# Patient Record
Sex: Female | Born: 1985 | Race: Black or African American | Hispanic: No | Marital: Single | Smoking: Former smoker
Health system: Southern US, Community
[De-identification: ages and names within clinical notes are randomized; demographics above are authoritative.]

## PROBLEM LIST (undated history)

## (undated) ENCOUNTER — Inpatient Hospital Stay (HOSPITAL_COMMUNITY): Payer: Self-pay

## (undated) DIAGNOSIS — A6000 Herpesviral infection of urogenital system, unspecified: Secondary | ICD-10-CM

## (undated) DIAGNOSIS — E119 Type 2 diabetes mellitus without complications: Secondary | ICD-10-CM

## (undated) DIAGNOSIS — I10 Essential (primary) hypertension: Secondary | ICD-10-CM

## (undated) DIAGNOSIS — O364XX0 Maternal care for intrauterine death, not applicable or unspecified: Principal | ICD-10-CM

## (undated) DIAGNOSIS — K219 Gastro-esophageal reflux disease without esophagitis: Secondary | ICD-10-CM

## (undated) DIAGNOSIS — J45909 Unspecified asthma, uncomplicated: Secondary | ICD-10-CM

## (undated) DIAGNOSIS — B019 Varicella without complication: Secondary | ICD-10-CM

## (undated) HISTORY — PX: NO PAST SURGERIES: SHX2092

---

## 2014-11-12 ENCOUNTER — Other Ambulatory Visit: Payer: Self-pay | Admitting: Obstetrics and Gynecology

## 2014-11-12 DIAGNOSIS — N6325 Unspecified lump in the left breast, overlapping quadrants: Secondary | ICD-10-CM

## 2014-11-12 DIAGNOSIS — N6320 Unspecified lump in the left breast, unspecified quadrant: Principal | ICD-10-CM

## 2014-11-16 ENCOUNTER — Other Ambulatory Visit: Payer: Self-pay

## 2014-11-19 ENCOUNTER — Other Ambulatory Visit: Payer: Self-pay

## 2016-01-17 NOTE — L&D Delivery Note (Signed)
Patient is 31 y.o. G1P0100 5773w6d admitted for IOL for IUFD and HELLP syndrome. S/p IOL with foley bulb, cytotec, followed by Pitocin.   Delivery Note At 6:39 AM a non-viable female was delivered via Vaginal, Spontaneous Delivery.  APGAR: 0, 0; weight 2 lb 5.7 oz (1069 g).   Placenta status: spontaneous, intact, 50% infarcted, sent to pathology.  Cord:  3 vessel   Anesthesia:  Epidural Episiotomy: None Lacerations: None Suture Repair: N/A Est. Blood Loss (mL): 50  Mom to antepartum.  [facility].    Upon arrival, patient had delivered neonate onto bed without difficulty. Placenta delivered spontaneously with gentle cord traction. Fundus firm with massage and Pitocin. Perineum inspected and found to have no lacerations.  Counts of sharps, instruments, and lap pads were all correct.   Larene BeachMary K Huyen Perazzo, DO PGY-2 09/27/2016, 7:41 AM

## 2016-04-10 LAB — OB RESULTS CONSOLE HIV ANTIBODY (ROUTINE TESTING): HIV: NONREACTIVE

## 2016-04-10 LAB — OB RESULTS CONSOLE RPR: RPR: NONREACTIVE

## 2016-04-10 LAB — OB RESULTS CONSOLE GC/CHLAMYDIA
Chlamydia: NEGATIVE
Gonorrhea: NEGATIVE

## 2016-04-10 LAB — OB RESULTS CONSOLE PLATELET COUNT: Platelets: 279

## 2016-04-10 LAB — OB RESULTS CONSOLE HGB/HCT, BLOOD
HCT: 36
Hemoglobin: 11.9

## 2016-04-10 LAB — OB RESULTS CONSOLE RUBELLA ANTIBODY, IGM: RUBELLA: IMMUNE

## 2016-04-10 LAB — OB RESULTS CONSOLE VARICELLA ZOSTER ANTIBODY, IGG: VARICELLA IGG: IMMUNE

## 2016-04-10 LAB — OB RESULTS CONSOLE ABO/RH: RH Type: POSITIVE

## 2016-04-10 LAB — OB RESULTS CONSOLE HEPATITIS B SURFACE ANTIGEN: HEP B S AG: NEGATIVE

## 2016-09-25 ENCOUNTER — Encounter (HOSPITAL_COMMUNITY): Payer: Self-pay

## 2016-09-25 ENCOUNTER — Emergency Department (HOSPITAL_COMMUNITY): Admission: EM | Admit: 2016-09-25 | Discharge: 2016-09-26 | Discharge status: Against Medical Advice | Payer: Self-pay

## 2016-09-25 ENCOUNTER — Inpatient Hospital Stay (EMERGENCY_DEPARTMENT_HOSPITAL)
Admission: AD | Admit: 2016-09-25 | Discharge: 2016-09-26 | Disposition: A | Discharge status: Home / Self Care | Payer: 59 | Source: Ambulatory Visit | Attending: Obstetrics & Gynecology | Admitting: Obstetrics & Gynecology

## 2016-09-25 DIAGNOSIS — O364XX0 Maternal care for intrauterine death, not applicable or unspecified: Secondary | ICD-10-CM

## 2016-09-25 DIAGNOSIS — O1423 HELLP syndrome (HELLP), third trimester: Secondary | ICD-10-CM

## 2016-09-25 DIAGNOSIS — Z3493 Encounter for supervision of normal pregnancy, unspecified, third trimester: Secondary | ICD-10-CM

## 2016-09-25 HISTORY — DX: Gastro-esophageal reflux disease without esophagitis: K21.9

## 2016-09-25 HISTORY — DX: Unspecified asthma, uncomplicated: J45.909

## 2016-09-25 HISTORY — DX: Maternal care for intrauterine death, not applicable or unspecified: O36.4XX0

## 2016-09-25 NOTE — MAU Note (Signed)
Pt here with report of no heart tones at the office today. Was told to go home and that they would call her tomorrow with a  plan of care. Wanted to come for a second opinion.

## 2016-09-25 NOTE — ED Notes (Signed)
Pt called x 1 no answer

## 2016-09-26 ENCOUNTER — Telehealth: Payer: Self-pay | Admitting: Obstetrics and Gynecology

## 2016-09-26 ENCOUNTER — Encounter (HOSPITAL_COMMUNITY): Payer: Self-pay | Admitting: *Deleted

## 2016-09-26 ENCOUNTER — Emergency Department (HOSPITAL_COMMUNITY): Payer: 59

## 2016-09-26 ENCOUNTER — Inpatient Hospital Stay (HOSPITAL_COMMUNITY)
Admission: AD | Admit: 2016-09-26 | Discharge: 2016-09-30 | DRG: 774 | Disposition: A | Discharge status: Home / Self Care | Payer: 59 | Source: Ambulatory Visit | Attending: Family Medicine | Admitting: Family Medicine

## 2016-09-26 ENCOUNTER — Encounter: Payer: Self-pay | Admitting: Obstetrics & Gynecology

## 2016-09-26 ENCOUNTER — Inpatient Hospital Stay (HOSPITAL_COMMUNITY): Payer: 59 | Admitting: Anesthesiology

## 2016-09-26 DIAGNOSIS — O364XX0 Maternal care for intrauterine death, not applicable or unspecified: Secondary | ICD-10-CM

## 2016-09-26 DIAGNOSIS — O4103X0 Oligohydramnios, third trimester, not applicable or unspecified: Secondary | ICD-10-CM | POA: Diagnosis present

## 2016-09-26 DIAGNOSIS — O1424 HELLP syndrome, complicating childbirth: Secondary | ICD-10-CM | POA: Diagnosis present

## 2016-09-26 DIAGNOSIS — A6000 Herpesviral infection of urogenital system, unspecified: Secondary | ICD-10-CM | POA: Diagnosis present

## 2016-09-26 DIAGNOSIS — E669 Obesity, unspecified: Secondary | ICD-10-CM | POA: Diagnosis present

## 2016-09-26 DIAGNOSIS — O1423 HELLP syndrome (HELLP), third trimester: Secondary | ICD-10-CM | POA: Diagnosis present

## 2016-09-26 DIAGNOSIS — O9832 Other infections with a predominantly sexual mode of transmission complicating childbirth: Secondary | ICD-10-CM | POA: Diagnosis present

## 2016-09-26 DIAGNOSIS — Z3A31 31 weeks gestation of pregnancy: Secondary | ICD-10-CM | POA: Diagnosis not present

## 2016-09-26 DIAGNOSIS — Z87891 Personal history of nicotine dependence: Secondary | ICD-10-CM

## 2016-09-26 DIAGNOSIS — Z6834 Body mass index (BMI) 34.0-34.9, adult: Secondary | ICD-10-CM

## 2016-09-26 DIAGNOSIS — O99214 Obesity complicating childbirth: Secondary | ICD-10-CM | POA: Diagnosis present

## 2016-09-26 HISTORY — DX: Varicella without complication: B01.9

## 2016-09-26 HISTORY — DX: Maternal care for intrauterine death, not applicable or unspecified: O36.4XX0

## 2016-09-26 HISTORY — DX: Herpesviral infection of urogenital system, unspecified: A60.00

## 2016-09-26 LAB — RAPID URINE DRUG SCREEN, HOSP PERFORMED
AMPHETAMINES: NOT DETECTED
BENZODIAZEPINES: NOT DETECTED
Barbiturates: NOT DETECTED
Cocaine: NOT DETECTED
OPIATES: NOT DETECTED
TETRAHYDROCANNABINOL: POSITIVE — AB

## 2016-09-26 LAB — COMPREHENSIVE METABOLIC PANEL
ALBUMIN: 2.7 g/dL — AB (ref 3.5–5.0)
ALBUMIN: 2.6 g/dL — AB (ref 3.5–5.0)
ALBUMIN: 2.7 g/dL — AB (ref 3.5–5.0)
ALK PHOS: 131 U/L — AB (ref 38–126)
ALK PHOS: 125 U/L (ref 38–126)
ALT: 220 U/L — ABNORMAL HIGH (ref 14–54)
ALT: 212 U/L — AB (ref 14–54)
ALT: 213 U/L — AB (ref 14–54)
ALT: 203 U/L — ABNORMAL HIGH (ref 14–54)
ALT: 207 U/L — ABNORMAL HIGH (ref 14–54)
ANION GAP: 10 (ref 5–15)
ANION GAP: 8 (ref 5–15)
ANION GAP: 9 (ref 5–15)
ANION GAP: 8 (ref 5–15)
AST: 174 U/L — AB (ref 15–41)
AST: 175 U/L — AB (ref 15–41)
AST: 167 U/L — ABNORMAL HIGH (ref 15–41)
AST: 165 U/L — ABNORMAL HIGH (ref 15–41)
AST: 141 U/L — ABNORMAL HIGH (ref 15–41)
Albumin: 2.5 g/dL — ABNORMAL LOW (ref 3.5–5.0)
Albumin: 2.7 g/dL — ABNORMAL LOW (ref 3.5–5.0)
Alkaline Phosphatase: 148 U/L — ABNORMAL HIGH (ref 38–126)
Alkaline Phosphatase: 135 U/L — ABNORMAL HIGH (ref 38–126)
Alkaline Phosphatase: 131 U/L — ABNORMAL HIGH (ref 38–126)
Anion gap: 10 (ref 5–15)
BILIRUBIN TOTAL: 1.3 mg/dL — AB (ref 0.3–1.2)
BILIRUBIN TOTAL: 1.4 mg/dL — AB (ref 0.3–1.2)
BILIRUBIN TOTAL: 1.5 mg/dL — AB (ref 0.3–1.2)
BUN: 12 mg/dL (ref 6–20)
BUN: 11 mg/dL (ref 6–20)
BUN: 8 mg/dL (ref 6–20)
BUN: 8 mg/dL (ref 6–20)
BUN: 7 mg/dL (ref 6–20)
CALCIUM: 8.6 mg/dL — AB (ref 8.9–10.3)
CALCIUM: 8.5 mg/dL — AB (ref 8.9–10.3)
CALCIUM: 7.9 mg/dL — AB (ref 8.9–10.3)
CHLORIDE: 102 mmol/L (ref 101–111)
CO2: 20 mmol/L — AB (ref 22–32)
CO2: 20 mmol/L — ABNORMAL LOW (ref 22–32)
CO2: 22 mmol/L (ref 22–32)
CO2: 22 mmol/L (ref 22–32)
CO2: 25 mmol/L (ref 22–32)
CREATININE: 0.67 mg/dL (ref 0.44–1.00)
CREATININE: 0.59 mg/dL (ref 0.44–1.00)
CREATININE: 0.56 mg/dL (ref 0.44–1.00)
Calcium: 8.1 mg/dL — ABNORMAL LOW (ref 8.9–10.3)
Calcium: 7.8 mg/dL — ABNORMAL LOW (ref 8.9–10.3)
Chloride: 106 mmol/L (ref 101–111)
Chloride: 106 mmol/L (ref 101–111)
Chloride: 105 mmol/L (ref 101–111)
Chloride: 105 mmol/L (ref 101–111)
Creatinine, Ser: 0.6 mg/dL (ref 0.44–1.00)
Creatinine, Ser: 0.61 mg/dL (ref 0.44–1.00)
GFR calc Af Amer: >60 mL/min (ref >60)
GFR calc Af Amer: >60 mL/min (ref >60)
GFR calc non Af Amer: >60 mL/min (ref >60)
GFR calc non Af Amer: >60 mL/min (ref >60)
GLUCOSE: 91 mg/dL (ref 65–99)
GLUCOSE: 92 mg/dL (ref 65–99)
GLUCOSE: 88 mg/dL (ref 65–99)
Glucose, Bld: 93 mg/dL (ref 65–99)
Glucose, Bld: 138 mg/dL — ABNORMAL HIGH (ref 65–99)
POTASSIUM: 4.1 mmol/L (ref 3.5–5.1)
POTASSIUM: 4.1 mmol/L (ref 3.5–5.1)
POTASSIUM: 4.7 mmol/L (ref 3.5–5.1)
Potassium: 3.9 mmol/L (ref 3.5–5.1)
Potassium: 4.3 mmol/L (ref 3.5–5.1)
SODIUM: 136 mmol/L (ref 135–145)
SODIUM: 135 mmol/L (ref 135–145)
Sodium: 136 mmol/L (ref 135–145)
Sodium: 136 mmol/L (ref 135–145)
Sodium: 135 mmol/L (ref 135–145)
TOTAL PROTEIN: 6.4 g/dL — AB (ref 6.5–8.1)
TOTAL PROTEIN: 6.7 g/dL (ref 6.5–8.1)
Total Bilirubin: 1.2 mg/dL (ref 0.3–1.2)
Total Bilirubin: 1.1 mg/dL (ref 0.3–1.2)
Total Protein: 6.7 g/dL (ref 6.5–8.1)
Total Protein: 6.1 g/dL — ABNORMAL LOW (ref 6.5–8.1)
Total Protein: 6.9 g/dL (ref 6.5–8.1)

## 2016-09-26 LAB — CBC
HCT: 30.4 % — ABNORMAL LOW (ref 36.0–46.0)
HCT: 29.9 % — ABNORMAL LOW (ref 36.0–46.0)
HCT: 30.2 % — ABNORMAL LOW (ref 36.0–46.0)
HEMATOCRIT: 32.2 % — AB (ref 36.0–46.0)
HEMATOCRIT: 31 % — AB (ref 36.0–46.0)
HEMOGLOBIN: 10.8 g/dL — AB (ref 12.0–15.0)
HEMOGLOBIN: 11.1 g/dL — AB (ref 12.0–15.0)
HEMOGLOBIN: 10.8 g/dL — AB (ref 12.0–15.0)
Hemoglobin: 11.5 g/dL — ABNORMAL LOW (ref 12.0–15.0)
Hemoglobin: 10.7 g/dL — ABNORMAL LOW (ref 12.0–15.0)
MCH: 32.8 pg (ref 26.0–34.0)
MCH: 32.9 pg (ref 26.0–34.0)
MCH: 33 pg (ref 26.0–34.0)
MCH: 33.5 pg (ref 26.0–34.0)
MCH: 33 pg (ref 26.0–34.0)
MCHC: 35.7 g/dL (ref 30.0–36.0)
MCHC: 35.5 g/dL (ref 30.0–36.0)
MCHC: 35.8 g/dL (ref 30.0–36.0)
MCHC: 36.1 g/dL — AB (ref 30.0–36.0)
MCHC: 35.4 g/dL (ref 30.0–36.0)
MCV: 91.7 fL (ref 78.0–100.0)
MCV: 92.7 fL (ref 78.0–100.0)
MCV: 92.3 fL (ref 78.0–100.0)
MCV: 92.9 fL (ref 78.0–100.0)
MCV: 93.2 fL (ref 78.0–100.0)
PLATELETS: 44 10*3/uL — AB (ref 150–400)
PLATELETS: 97 10*3/uL — AB (ref 150–400)
Platelets: 48 10*3/uL — ABNORMAL LOW (ref 150–400)
Platelets: 50 10*3/uL — ABNORMAL LOW (ref 150–400)
Platelets: 116 10*3/uL — ABNORMAL LOW (ref 150–400)
RBC: 3.51 MIL/uL — ABNORMAL LOW (ref 3.87–5.11)
RBC: 3.28 MIL/uL — AB (ref 3.87–5.11)
RBC: 3.36 MIL/uL — ABNORMAL LOW (ref 3.87–5.11)
RBC: 3.22 MIL/uL — ABNORMAL LOW (ref 3.87–5.11)
RBC: 3.24 MIL/uL — ABNORMAL LOW (ref 3.87–5.11)
RDW: 15.3 % (ref 11.5–15.5)
RDW: 15.3 % (ref 11.5–15.5)
RDW: 15.5 % (ref 11.5–15.5)
RDW: 15.4 % (ref 11.5–15.5)
RDW: 15.6 % — AB (ref 11.5–15.5)
WBC: 14.1 10*3/uL — AB (ref 4.0–10.5)
WBC: 15.2 10*3/uL — AB (ref 4.0–10.5)
WBC: 16.4 10*3/uL — AB (ref 4.0–10.5)
WBC: 16.7 10*3/uL — ABNORMAL HIGH (ref 4.0–10.5)
WBC: 14.3 10*3/uL — AB (ref 4.0–10.5)

## 2016-09-26 LAB — URINALYSIS, ROUTINE W REFLEX MICROSCOPIC
Bilirubin Urine: NEGATIVE
Glucose, UA: NEGATIVE mg/dL
Ketones, ur: NEGATIVE mg/dL
Leukocytes, UA: NEGATIVE
NITRITE: NEGATIVE
Protein, ur: 30 mg/dL — AB
SPECIFIC GRAVITY, URINE: 1.016 (ref 1.005–1.030)
pH: 7 (ref 5.0–8.0)

## 2016-09-26 LAB — DIC (DISSEMINATED INTRAVASCULAR COAGULATION)PANEL
D-Dimer, Quant: 19.46 ug/mL-FEU — ABNORMAL HIGH (ref 0.00–0.50)
Platelets: 48 10*3/uL — ABNORMAL LOW (ref 150–400)
Prothrombin Time: 12.4 seconds (ref 11.4–15.2)
Smear Review: NONE SEEN

## 2016-09-26 LAB — DIC (DISSEMINATED INTRAVASCULAR COAGULATION) PANEL
APTT: 33 s (ref 24–36)
FIBRINOGEN: 631 mg/dL — AB (ref 210–475)
INR: 0.93

## 2016-09-26 LAB — PROTEIN / CREATININE RATIO, URINE
Creatinine, Urine: 86 mg/dL
PROTEIN CREATININE RATIO: 0.86 mg/mg{creat} — AB (ref 0.00–0.15)
TOTAL PROTEIN, URINE: 74 mg/dL

## 2016-09-26 LAB — TSH: TSH: 2.492 u[IU]/mL (ref 0.350–4.500)

## 2016-09-26 LAB — ABO/RH: ABO/RH(D): B POS

## 2016-09-26 LAB — HEMOGLOBIN A1C
HEMOGLOBIN A1C: 4.9 % (ref 4.8–5.6)
MEAN PLASMA GLUCOSE: 93.93 mg/dL

## 2016-09-26 LAB — HIV ANTIBODY (ROUTINE TESTING W REFLEX): HIV Screen 4th Generation wRfx: NONREACTIVE

## 2016-09-26 LAB — KLEIHAUER-BETKE STAIN
# VIALS RHIG: 1
Fetal Cells %: 0 %
Quantitation Fetal Hemoglobin: 0 mL

## 2016-09-26 LAB — RPR: RPR Ser Ql: NONREACTIVE

## 2016-09-26 LAB — PREPARE RBC (CROSSMATCH)

## 2016-09-26 LAB — LACTATE DEHYDROGENASE: LDH: 359 U/L — ABNORMAL HIGH (ref 98–192)

## 2016-09-26 MED ORDER — SOD CITRATE-CITRIC ACID 500-334 MG/5ML PO SOLN: 30.0000 mL | ORAL | Status: DC | PRN

## 2016-09-26 MED ORDER — DIPHENHYDRAMINE HCL 50 MG/ML IJ SOLN: 12.5000 mg | INTRAMUSCULAR | Status: DC | PRN

## 2016-09-26 MED ORDER — LABETALOL HCL 5 MG/ML IV SOLN: 20.0000 mg | INTRAVENOUS | Status: DC | PRN

## 2016-09-26 MED ORDER — MAGNESIUM SULFATE BOLUS VIA INFUSION
4.0000 g | Freq: Once | INTRAVENOUS | Status: AC
  Administered 2016-09-26: 4 g via INTRAVENOUS
  Filled 2016-09-26: qty 500

## 2016-09-26 MED ORDER — PHENYLEPHRINE 40 MCG/ML (10ML) SYRINGE FOR IV PUSH (FOR BLOOD PRESSURE SUPPORT)
PREFILLED_SYRINGE | INTRAVENOUS | Status: AC
  Filled 2016-09-26: qty 10

## 2016-09-26 MED ORDER — MISOPROSTOL 200 MCG PO TABS
50.0000 ug | ORAL_TABLET | ORAL | Status: DC
  Administered 2016-09-26: 50 ug via VAGINAL
  Filled 2016-09-26: qty 1

## 2016-09-26 MED ORDER — OXYTOCIN 40 UNITS IN LACTATED RINGERS INFUSION - SIMPLE MED
1.0000 m[IU]/min | INTRAVENOUS | Status: DC
  Administered 2016-09-27: 2 m[IU]/min via INTRAVENOUS
  Filled 2016-09-26: qty 1000

## 2016-09-26 MED ORDER — TRANEXAMIC ACID 1000 MG/10ML IV SOLN
1000.0000 mg | Freq: Once | INTRAVENOUS | Status: DC
  Filled 2016-09-26: qty 10

## 2016-09-26 MED ORDER — LIDOCAINE HCL (PF) 1 % IJ SOLN
INTRAMUSCULAR | Status: DC | PRN
  Administered 2016-09-26 (×2): 5 mL via EPIDURAL

## 2016-09-26 MED ORDER — ACETAMINOPHEN 325 MG PO TABS
650.0000 mg | ORAL_TABLET | ORAL | Status: DC | PRN
  Administered 2016-09-26: 650 mg via ORAL
  Filled 2016-09-26: qty 2

## 2016-09-26 MED ORDER — PHENYLEPHRINE 40 MCG/ML (10ML) SYRINGE FOR IV PUSH (FOR BLOOD PRESSURE SUPPORT)
80.0000 ug | PREFILLED_SYRINGE | INTRAVENOUS | Status: DC | PRN
  Filled 2016-09-26: qty 5

## 2016-09-26 MED ORDER — ONDANSETRON HCL 4 MG/2ML IJ SOLN: 4.0000 mg | Freq: Four times a day (QID) | INTRAMUSCULAR | Status: DC | PRN

## 2016-09-26 MED ORDER — DIPHENHYDRAMINE HCL 25 MG PO CAPS
25.0000 mg | ORAL_CAPSULE | Freq: Once | ORAL | Status: AC
  Administered 2016-09-26: 25 mg via ORAL
  Filled 2016-09-26: qty 1

## 2016-09-26 MED ORDER — FENTANYL CITRATE (PF) 100 MCG/2ML IJ SOLN
50.0000 ug | INTRAMUSCULAR | Status: DC | PRN
  Administered 2016-09-26: 50 ug via INTRAVENOUS
  Filled 2016-09-26: qty 2

## 2016-09-26 MED ORDER — OXYCODONE-ACETAMINOPHEN 5-325 MG PO TABS: 1.0000 | ORAL_TABLET | ORAL | Status: DC | PRN

## 2016-09-26 MED ORDER — MISOPROSTOL 50MCG HALF TABLET
100.0000 ug | ORAL_TABLET | ORAL | Status: AC | PRN
  Administered 2016-09-26 (×3): 100 ug via BUCCAL
  Filled 2016-09-26 (×4): qty 2

## 2016-09-26 MED ORDER — HYDROXYZINE HCL 50 MG PO TABS
50.0000 mg | ORAL_TABLET | Freq: Four times a day (QID) | ORAL | Status: DC | PRN
  Filled 2016-09-26: qty 1

## 2016-09-26 MED ORDER — LACTATED RINGERS IV SOLN: 500.0000 mL | INTRAVENOUS | Status: DC | PRN

## 2016-09-26 MED ORDER — MISOPROSTOL 50MCG HALF TABLET
50.0000 ug | ORAL_TABLET | Freq: Once | ORAL | Status: AC
  Administered 2016-09-26: 50 ug via VAGINAL
  Filled 2016-09-26: qty 1

## 2016-09-26 MED ORDER — LACTATED RINGERS IV SOLN: 500.0000 mL | Freq: Once | INTRAVENOUS | Status: DC

## 2016-09-26 MED ORDER — OXYTOCIN BOLUS FROM INFUSION
500.0000 mL | Freq: Once | INTRAVENOUS | Status: AC
  Administered 2016-09-27: 500 mL via INTRAVENOUS

## 2016-09-26 MED ORDER — HYDRALAZINE HCL 20 MG/ML IJ SOLN: 10.0000 mg | Freq: Once | INTRAMUSCULAR | Status: DC | PRN

## 2016-09-26 MED ORDER — PHENYLEPHRINE 40 MCG/ML (10ML) SYRINGE FOR IV PUSH (FOR BLOOD PRESSURE SUPPORT): 80.0000 ug | PREFILLED_SYRINGE | INTRAVENOUS | Status: DC | PRN

## 2016-09-26 MED ORDER — LACTATED RINGERS IV SOLN
INTRAVENOUS | Status: DC
  Administered 2016-09-26 – 2016-09-27 (×3): via INTRAVENOUS

## 2016-09-26 MED ORDER — EPHEDRINE 5 MG/ML INJ: 10.0000 mg | INTRAVENOUS | Status: DC | PRN

## 2016-09-26 MED ORDER — SODIUM CHLORIDE 0.9 % IV SOLN
Freq: Once | INTRAVENOUS | Status: AC
  Administered 2016-09-26: 16:00:00 via INTRAVENOUS

## 2016-09-26 MED ORDER — ZOLPIDEM TARTRATE 5 MG PO TABS: 5.0000 mg | ORAL_TABLET | Freq: Every evening | ORAL | Status: DC | PRN

## 2016-09-26 MED ORDER — OXYTOCIN 40 UNITS IN LACTATED RINGERS INFUSION - SIMPLE MED
2.5000 [IU]/h | INTRAVENOUS | Status: DC
  Administered 2016-09-27: 2.5 [IU]/h via INTRAVENOUS

## 2016-09-26 MED ORDER — OXYCODONE-ACETAMINOPHEN 5-325 MG PO TABS: 2.0000 | ORAL_TABLET | ORAL | Status: DC | PRN

## 2016-09-26 MED ORDER — FENTANYL 2.5 MCG/ML BUPIVACAINE 1/10 % EPIDURAL INFUSION (WH - ANES)
INTRAMUSCULAR | Status: AC
  Filled 2016-09-26: qty 100

## 2016-09-26 MED ORDER — FENTANYL 2.5 MCG/ML BUPIVACAINE 1/10 % EPIDURAL INFUSION (WH - ANES)
14.0000 mL/h | INTRAMUSCULAR | Status: DC | PRN
  Administered 2016-09-26 – 2016-09-27 (×2): 14 mL/h via EPIDURAL
  Filled 2016-09-26: qty 100

## 2016-09-26 MED ORDER — FLEET ENEMA 7-19 GM/118ML RE ENEM: 1.0000 | ENEMA | Freq: Every day | RECTAL | Status: DC | PRN

## 2016-09-26 MED ORDER — ACETAMINOPHEN 500 MG PO TABS
1000.0000 mg | ORAL_TABLET | Freq: Once | ORAL | Status: AC
  Administered 2016-09-26: 1000 mg via ORAL
  Filled 2016-09-26: qty 2

## 2016-09-26 MED ORDER — SODIUM CHLORIDE 0.9 % IV SOLN
Freq: Once | INTRAVENOUS | Status: AC
  Administered 2016-09-26: 06:00:00 via INTRAVENOUS

## 2016-09-26 MED ORDER — EPHEDRINE 5 MG/ML INJ
10.0000 mg | INTRAVENOUS | Status: DC | PRN
  Filled 2016-09-26: qty 2

## 2016-09-26 MED ORDER — LIDOCAINE HCL (PF) 1 % IJ SOLN
30.0000 mL | INTRAMUSCULAR | Status: DC | PRN
  Filled 2016-09-26: qty 30

## 2016-09-26 MED ORDER — MAGNESIUM SULFATE 40 G IN LACTATED RINGERS - SIMPLE
2.0000 g/h | INTRAVENOUS | Status: DC
  Administered 2016-09-26 – 2016-09-27 (×2): 2 g/h via INTRAVENOUS
  Filled 2016-09-26 (×2): qty 40

## 2016-09-26 NOTE — Progress Notes (Signed)
Introductory visit with [name] and family to introduce spiritual care services upon the news that Deborah James's daughter Deborah James has no heartbeat and there is also concern about pt's health.  Pt is coping well at the moment.  FOB states, he's just trying to be "strong" for everyone and is aware that [name] can provide support for him as well.  Her family [place] from [place] ([place] and [place]). Pt's father is trying to keep everyone cheerful, which [name] appreciates as she's also concerned about her health and worried about her mother's grief.  We discussed the fullness of life and all its feelings and I normalized the need for both laughter and tears.   Family is aware of  resources for support and memory making and have begun discussing what support they would like after Deborah James's birth.  Spiritual Care team will continue to follow.  Please page as further needs arise.  Maryanna ShapeAmanda M. Carley Hammedavee Lomax, M.Div. [facility] Chaplain Pager [phone] Office [phone]     09/26/16 1622  Clinical Encounter Type  Visited With Patient and family together  Visit Type Spiritual support;Social support  Stress Factors  Patient Stress Factors Health changes;Loss  Family Stress Factors Loss;Loss of control

## 2016-09-26 NOTE — Anesthesia Procedure Notes (Signed)
Epidural Patient location during procedure: OB Start time: 09/26/2016 6:15 PM End time: 09/26/2016 6:25 PM  Staffing Anesthesiologist: Jathniel Smeltzer  Preanesthetic Checklist Completed: patient identified, site marked, surgical consent, pre-op evaluation, timeout performed, IV checked, risks and benefits discussed and monitors and equipment checked  Epidural Patient position: sitting Prep: site prepped and draped and DuraPrep Patient monitoring: continuous pulse ox and blood pressure Approach: midline Location: L4-L5 Injection technique: LOR air  Needle:  Needle type: Tuohy  Needle gauge: 17 G Needle length: 9 cm and 9 Needle insertion depth: 7 cm Catheter type: closed end flexible Catheter size: 19 Gauge Catheter at skin depth: 13 cm Test dose: negative  Assessment Events: blood not aspirated, injection not painful, no injection resistance, negative IV test and no paresthesia

## 2016-09-26 NOTE — Progress Notes (Signed)
Delayed entry note - patient seen and examined at 1300. Family supportive at bedside. She is not feeling any contractions or having any bleeding. Asking about expectant plan. Explained that we would add tranexamic acid at delivery to prevent bleeding.  Assessment: IUFD at 31+5 HELLP  Plan: -4u platelets to date (last count 50 despite 2u given over night) -Continue magnesium -Continue miso buccal 100 q4 (has had 2 to date) -Plan for tranexamic acid at delivery  Discussed with Dr. Adrian BlackwaterStinson.  Deborah CashJenna E. Cathryne Mancebo, MD Family Medicine Resident, PGY-3 09/26/2016 3:36 PM

## 2016-09-26 NOTE — H&P (Signed)
Obstetric History and Physical  Caryn Gienger is a 31 y.o. G1P0 with previously diagnosed IUFD at [redacted]w[redacted]d presenting for IOL for recently diagnosed HELLP.  She is a patient of [facility] in [place] and was diagnosed with IUFD yesterday.  She was told she will be called with plan today but she came into MAU earlier this morning for a second opinion.  Ultrasound confirmed diagnosis of IUFD here, and patient was advised to stay for admission and induction.  She reported that she wanted to spend time with her family and wanted to come in for scheduled IOL instead; this was scheduled 09/27/16 at 0730.  IUFD evaluation labs were done and she was discharged and told to return for bleeding or other concerns.    On review of her labs, thrombocytopenia of 48K and elevated LFTS (AST 174/ALT 220) were noted. Add-on LDH was elevated at 359.  Given the diagnosis of HELLP, patient was called and told to come back for urgent management and IOL.  On arrival, she denies any headaches or visual symptoms but endorses pain in her epigastric area and right upper quadrant.  Reviewed her records from [facility] , there was no history of any hypertensive disorders before or during pregnancy.  BP 118-136/74-86.  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  She denies any current contractions, bleeding or leaking of fluid.   Prenatal Course Pregnancy complications or risks: Patient Active Problem List   Diagnosis Date Noted  . HELLP syndrome (HELLP), third trimester 09/26/2016  . Fetal demise, greater than 22 weeks, antepartum 09/26/2016   Prenatal labs and studies: ABO, Rh: --/--/B POS, B POS (09/11 0119) Antibody: NEG (09/11 0119) Rubella: Immune (03/26 0000) RPR: Nonreactive (03/26 0000)  HBsAg: Negative (03/26 0000)  HIV: Non-reactive (03/26 0000)  1 hr Glucola  Normal Pap Negative Genetic screening normal Anatomy US  normal  Past Medical History:  Diagnosis Date  . Asthma   . Chicken pox    As a child  . Fetal demise in singleton pregnancy greater than [redacted] weeks gestation, antepartum 09/26/2016  . Genital HSV   . GERD (gastroesophageal reflux disease)     Past Surgical History:  Procedure Laterality Date  . NO PAST SURGERIES      OB History  Gravida Para Term Preterm AB Living  1            SAB TAB Ectopic Multiple Live Births               # Outcome Date GA Lbr Len/2nd Weight Sex Delivery Anes PTL Lv  1 Current              Social History   Social History  . Marital status: Single    Spouse name: N/A  . Number of children: N/A  . Years of education: N/A   Social History Main Topics  . Smoking status: Former Smoker    Quit date: 02/27/2016  . Smokeless tobacco: Never Used  . Alcohol use No  . Drug use: No  . Sexual activity: Yes   Other Topics Concern  . None   Social History Narrative  . None    History reviewed. No pertinent family history.  Prescriptions Prior to Admission  Medication Sig Dispense Refill Last Dose  . prenatal vitamin w/FE, FA (PRENATAL 1 + 1) 27-1 MG TABS tablet Take 1 tablet by mouth daily at 12 noon.  09/26/2016 at Unknown time    No Known Allergies  Review of Systems: Negative except for what is mentioned in HPI.  Physical Exam: BP (!) 149/95   Pulse 73   Temp 98.9 F (37.2 C)   Resp 18   Ht  (1.854 m)   Wt 262 lb (118.8 kg)   BMI 34.57 kg/m  CONSTITUTIONAL: Well-developed, well-nourished, obese,  female in no acute distress.  HENT:  Normocephalic, atraumatic, External right and left ear normal. Oropharynx is clear and moist EYES: Conjunctivae and EOM are normal. Pupils are equal, round, and reactive to light. No scleral icterus.  NECK: Normal range of motion, supple, no masses SKIN: Skin is warm and dry. No rash noted. Not diaphoretic. No erythema. No pallor. NEUROLOGIC: Alert and oriented to person, place, and time. 2+  reflexes, muscle tone coordination. No cranial nerve deficit noted. PSYCHIATRIC: Normal mood and affect. Normal behavior. Normal judgment and thought content. CARDIOVASCULAR: Normal heart rate noted, regular rhythm RESPIRATORY: Effort and breath sounds normal, no problems with respiration noted ABDOMEN: Soft, +moderate RUQ/epigastric tenderness, no rebound,, no guarding. nondistended, gravid. CERVIX: Closed/long/thick/posterior MUSCULOSKELETAL: Normal range of motion. No edema and no tenderness. 2+ distal pulses.   Pertinent Labs/Studies: 09/26/2016 Ultrasound  IUFD at [redacted]w[redacted]d. Oligohydramnios.  Cephalic presentation. Biometry for [facility] and FL <3%  Results for orders placed or performed during the hospital encounter of 09/26/16 (from the past 168 hour(s))  Lactate dehydrogenase   Collection Time: 09/26/16  1:19 AM  Result Value Ref Range   LDH 359 (H) 98 - 192 U/L  Prepare Pheresed Platelets   Collection Time: 09/26/16  3:30 AM  Result Value Ref Range   Unit Number [id number]    Blood Component Type PLTP LR2 PAS    Unit division 00    Status of Unit ALLOCATED    Transfusion Status OK TO TRANSFUSE    Unit Number [id number]    Blood Component Type PLTP LR3 PAS    Unit division 00    Status of Unit ALLOCATED    Transfusion Status OK TO TRANSFUSE   BPAM Platelet Pheresis   Collection Time: 09/26/16  3:30 AM  Result Value Ref Range   Blood Product Unit Number [id number]    Unit Type and Rh 6200    Blood Product Expiration Date [id number]    Blood Product Unit Number [id number]    Unit Type and Rh 7300    Blood Product Expiration Date [id number]   CBC   Collection Time: 09/26/16  4:13 AM  Result Value Ref Range   WBC 15.2 (H) 4.0 - 10.5 K/uL   RBC 3.28 (L) 3.87 - 5.11 MIL/uL   Hemoglobin 10.8 (L) 12.0 - 15.0 g/dL   HCT 33.2 (L) 95.1 - 88.4 %   MCV 92.7 78.0 - 100.0 fL   MCH 32.9 26.0 - 34.0 pg   MCHC 35.5 30.0 - 36.0 g/dL   RDW 16.6 06.3 - 01.6 %   Platelets 44  (L) 150 - 400 K/uL  Results for orders placed or performed during the hospital encounter of 09/25/16 (from the past 168 hour(s))  CBC   Collection Time: 09/26/16  1:17 AM  Result Value Ref Range   WBC 14.1 (H) 4.0 - 10.5 K/uL   RBC 3.51 (L) 3.87 - 5.11 MIL/uL   Hemoglobin 11.5 (L) 12.0 - 15.0 g/dL   HCT 01.0 (L) 93.2 - 35.5 %   MCV 91.7 78.0 - 100.0 fL   MCH 32.8 26.0 - 34.0  pg   MCHC 35.7 30.0 - 36.0 g/dL   RDW 72.515.3 36.611.5 - 44.015.5 %   Platelets 48 (L) 150 - 400 K/uL  Kleihauer-Betke stain   Collection Time: 09/26/16  1:17 AM  Result Value Ref Range   Fetal Cells % 0 %   Quantitation Fetal Hemoglobin 0 mL   # Vials RhIg 1   TSH   Collection Time: 09/26/16  1:17 AM  Result Value Ref Range   TSH 2.492 0.350 - 4.500 uIU/mL  Hemoglobin A1c   Collection Time: 09/26/16  1:17 AM  Result Value Ref Range   Hgb A1c MFr Bld 4.9 4.8 - 5.6 %   Mean Plasma Glucose 93.93 mg/dL  Comprehensive metabolic panel   Collection Time: 09/26/16  1:17 AM  Result Value Ref Range   Sodium 136 135 - 145 mmol/L   Potassium 4.1 3.5 - 5.1 mmol/L   Chloride 106 101 - 111 mmol/L   CO2 20 (L) 22 - 32 mmol/L   Glucose, Bld 91 65 - 99 mg/dL   BUN 12 6 - 20 mg/dL   Creatinine, Ser 3.470.60 0.44 - 1.00 mg/dL   Calcium 8.6 (L) 8.9 - 10.3 mg/dL   Total Protein 6.4 (L) 6.5 - 8.1 g/dL   Albumin 2.7 (L) 3.5 - 5.0 g/dL   AST 425174 (H) 15 - 41 U/L   ALT 220 (H) 14 - 54 U/L   Alkaline Phosphatase 148 (H) 38 - 126 U/L   Total Bilirubin 1.3 (H) 0.3 - 1.2 mg/dL   GFR calc non Af Amer >60 >60 mL/min   GFR calc Af Amer >60 >60 mL/min   Anion gap 10 5 - 15  DIC (disseminated intravasc coag) panel   Collection Time: 09/26/16  1:17 AM  Result Value Ref Range   Prothrombin Time 12.4 11.4 - 15.2 seconds   INR 0.93    aPTT 33 24 - 36 seconds   Fibrinogen 631 (H) 210 - 475 mg/dL   D-Dimer, Quant 95.6319.46 (H) 0.00 - 0.50 ug/mL-FEU   Platelets 48 (L) 150 - 400 K/uL   Smear Review NO SCHISTOCYTES SEEN   Type and screen    Collection Time: 09/26/16  1:19 AM  Result Value Ref Range   ABO/RH(D) B POS    Antibody Screen NEG    Sample Expiration 09/29/2016   ABO/Rh   Collection Time: 09/26/16  1:19 AM  Result Value Ref Range   ABO/RH(D) B POS    Assessment : Walker ShadowShanee Hovanec is a 31 y.o. G1P0 at 5557w5d being admitted for induction of labor due to intrauterine fetal demise in the setting of HELLP.  Plan: Labor: Will start induction with misoprostol 100 mcg every 6 hours.  Analgesia as needed. HELLP: No BP medication needed for now, Labetalol protocol ordered prn.  Magnesium sulfate ordered for eclampsia prophylaxis. Will transfuse with one unit of platelets now, Blood Bank notified to get more units ready for this patient. Will check q 6 hour CBC and CMET. Delivery plan: Hopeful for vaginal delivery.    Jaynie CollinsUGONNA  Aaiden Depoy, MD, FACOG Attending Obstetrician & Gynecologist Faculty Practice,  [facility]

## 2016-09-26 NOTE — Progress Notes (Signed)
Cervical exam unchanged despite Cytotec x3. Attempted foley bulb without success given FT os, long, high. Will proceed with 2 more doses of Cytotec then reattempt FB. Plt's to be rechecked after 2nd set of 2u plts given this afternoon.  Burna CashJenna E. Kenshin Splawn, MD Family Medicine Resident, PGY-3 09/26/2016 5:32 PM

## 2016-09-26 NOTE — MAU Provider Note (Signed)
History     CSN: [id number]  Arrival date and time: 09/25/16 2306   First Provider Initiated Contact with Patient 09/26/16 0003      Chief Complaint  Patient presents with  . Decreased Fetal Movement   HPI  Deborah James is a 31 y.o. G1P0 at 9424w5d gestation presenting to MAU for a "second opinion".  She was seen at her regular OB appt today and was told her "baby had no heartbeat".  She was sent home and told someone would call "to check up on her and let her know the plan of care".  She states she is nervous and not sure what to do.  She denies pain, VB or LOF.  Past Medical History:  Diagnosis Date  . Asthma   . Fetal demise in singleton pregnancy greater than [redacted] weeks gestation, antepartum 09/26/2016  . GERD (gastroesophageal reflux disease)     Past Surgical History:  Procedure Laterality Date  . NO PAST SURGERIES      History reviewed. No pertinent family history.  Social History  Substance Use Topics  . Smoking status: Former Smoker    Quit date: 02/27/2016  . Smokeless tobacco: Never Used  . Alcohol use No    Allergies: No Known Allergies  Prescriptions Prior to Admission  Medication Sig Dispense Refill Last Dose  . prenatal vitamin w/FE, FA (PRENATAL 1 + 1) 27-1 MG TABS tablet Take 1 tablet by mouth daily at 12 noon.   09/26/2016 at Unknown time    Review of Systems  Constitutional: Negative.   HENT: Negative.   Eyes: Negative.   Respiratory: Negative.   Cardiovascular: Negative.   Gastrointestinal: Negative.   Endocrine: Negative.   Genitourinary: Negative.        No fetal movement; "told by doctor baby has no heartbeat"  Musculoskeletal: Negative.   Skin: Negative.   Allergic/Immunologic: Negative.   Neurological: Negative.   Hematological: Negative.   Psychiatric/Behavioral: Negative.    Physical Exam   Blood pressure 138/82, pulse 75, temperature 98.7 F (37.1 C), temperature source Oral, resp. rate 16, height 6\' 1"  (1.854 m), weight  118.8 kg (262 lb), SpO2 100 %.  Physical Exam  Constitutional: She is oriented to person, place, and time. She appears well-developed and well-nourished.  HENT:  Head: Normocephalic.  Eyes: Pupils are equal, round, and reactive to light.  Neck: Normal range of motion.  Cardiovascular: Normal rate, regular rhythm and normal heart sounds.   Respiratory: Effort normal and breath sounds normal.  GI: Soft. Bowel sounds are normal.  Musculoskeletal: Normal range of motion.  Neurological: She is alert and oriented to person, place, and time.  Skin: Skin is warm and dry.  Psychiatric: She has a normal mood and affect. Her behavior is normal. Judgment and thought content normal.    MAU Course  Procedures  MDM CCUA CBC UDS KLB TSH HIV HSV 1 & 2 IgG DIC HgB A1C RPR  Rubella Toxoplasma CMV IgG OB Limited U/S  *Consult with Dr. Macon LargeAnyanwu - notified of patient's complaints, assessments, lab & U/S results, recommend tx plan offer pt IOL d/t where she lives and convenience for her and family at this time, discuss with pt non-emergent IOL can be scheduled as well or she can [facility], get ROI for records incase she decides to deliver here - ok to d/c home, pt notified of IOL scheduled for 09/27/2016 @ 0730.  Assessment and Plan  Fetal demise  in singleton pregnancy greater than [redacted] weeks gestation, antepartum - Labs pending - Return for IOL on 09/27/2016 at 0715 for registration and appt time at 0730 - Advised to return to MAU for any pain, VB or LOF  Discharge home Patient verbalized an understanding of the plan of care and agrees.   Raelyn Mora, MSN, CNM 09/26/2016, 12:04 AM

## 2016-09-26 NOTE — Progress Notes (Signed)
Deborah James is a 31 y.o. G1P0 at 4638w5d by admitted for IOL for IUFD.  Subjective: Doing well.  Some nausea and vomiting that has resolved.  No headache, vision changes, RUQ or epigastric pain.  In good spirits with family at bedside.  Objective: BP (!) 142/94   Pulse 72   Temp 98.8 F (37.1 C) (Oral)   Resp 18   Ht 6\' 1"  (1.854 m)   Wt 118.8 kg (262 lb)   SpO2 100%   BMI 34.57 kg/m  I/O last 3 completed shifts: In: 3979.9 [P.O.:920; I.V.:1947.9; Blood:1112] Out: 3050 [Urine:3050] Total I/O In: 260.1 [P.O.:112; I.V.:125; Other:23.1] Out: 150 [Urine:150]   UC:  q6 min SVE:   Dilation: 1 Effacement (%): 50 Station: -3 Exam by:: dr Deborah James  Labs: Lab Results  Component Value Date   WBC 16.7 (H) 09/26/2016   HGB 10.8 (L) 09/26/2016   HCT 29.9 (L) 09/26/2016   MCV 92.9 09/26/2016   PLT 116 (L) 09/26/2016    Assessment / Plan: IOL for IUFD  Labor: progressing s/p cytotec, FB placed successfully, continue to monitor Preeclampsia:  on magnesium sulfate and no signs or symptoms of toxicity Fetal Wellbeing:  N/A Pain Control:  Epidural I/D:  n/a Anticipated MOD:  NSVD  Deborah LeschMary K Ona Rathert DO PGY-2 09/26/2016, 9:05 PM

## 2016-09-26 NOTE — Progress Notes (Signed)
Faculty Practice OB/GYN Attending Note   Labs evaluated and concerning for HELLP.  Records reviewed from [place], no history of any hypertensive disorder or other significant problems during this pregnancy.  When she arrived to MAU her BP was 142/87 then decreased to 138/82, and she denied any BP problems at that time.  She left prior to the labs being completed as she wanted to be with family during this rough time.  Results for orders placed or performed during the hospital encounter of 09/25/16 (from the past 24 hour(s))  CBC     Status: Abnormal   Collection Time: 09/26/16  1:17 AM  Result Value Ref Range   WBC 14.1 (H) 4.0 - 10.5 K/uL   RBC 3.51 (L) 3.87 - 5.11 MIL/uL   Hemoglobin 11.5 (L) 12.0 - 15.0 g/dL   HCT 16.1 (L) 09.6 - 04.5 %   MCV 91.7 78.0 - 100.0 fL   MCH 32.8 26.0 - 34.0 pg   MCHC 35.7 30.0 - 36.0 g/dL   RDW 40.9 81.1 - 91.4 %   Platelets 48 (L) 150 - 400 K/uL  TSH     Status: None   Collection Time: 09/26/16  1:17 AM  Result Value Ref Range   TSH 2.492 0.350 - 4.500 uIU/mL  Hemoglobin A1c     Status: None   Collection Time: 09/26/16  1:17 AM  Result Value Ref Range   Hgb A1c MFr Bld 4.9 4.8 - 5.6 %   Mean Plasma Glucose 93.93 mg/dL  Comprehensive metabolic panel     Status: Abnormal   Collection Time: 09/26/16  1:17 AM  Result Value Ref Range   Sodium 136 135 - 145 mmol/L   Potassium 4.1 3.5 - 5.1 mmol/L   Chloride 106 101 - 111 mmol/L   CO2 20 (L) 22 - 32 mmol/L   Glucose, Bld 91 65 - 99 mg/dL   BUN 12 6 - 20 mg/dL   Creatinine, Ser 7.82 0.44 - 1.00 mg/dL   Calcium 8.6 (L) 8.9 - 10.3 mg/dL   Total Protein 6.4 (L) 6.5 - 8.1 g/dL   Albumin 2.7 (L) 3.5 - 5.0 g/dL   AST 956 (H) 15 - 41 U/L   ALT 220 (H) 14 - 54 U/L   Alkaline Phosphatase 148 (H) 38 - 126 U/L   Total Bilirubin 1.3 (H) 0.3 - 1.2 mg/dL   GFR calc non Af Amer >60 >60 mL/min   GFR calc Af Amer >60 >60 mL/min   Anion gap 10 5 - 15  DIC (disseminated intravasc coag) panel     Status: Abnormal    Collection Time: 09/26/16  1:17 AM  Result Value Ref Range   Prothrombin Time 12.4 11.4 - 15.2 seconds   INR 0.93    aPTT 33 24 - 36 seconds   Fibrinogen 631 (H) 210 - 475 mg/dL   D-Dimer, Quant 21.30 (H) 0.00 - 0.50 ug/mL-FEU   Platelets 48 (L) 150 - 400 K/uL   Smear Review NO SCHISTOCYTES SEEN   Type and screen     Status: None   Collection Time: 09/26/16  1:19 AM  Result Value Ref Range   ABO/RH(D) B POS    Antibody Screen NEG    Sample Expiration 09/29/2016    Patient was called at home and told to return to hospital immediately for IOL now due to HELLP. L&D RN in charge notified.  Blood bank called and notified of need for platelet transfusion. Will check labs q6h. Hopeful  for induction of labor leading to vaginal delivery; operative delivery will only be considered for rapidly deterioration of patient status.       Jaynie CollinsUGONNA  ANYANWU, MD, FACOG Attending Obstetrician & Gynecologist Faculty Practice, [facility]

## 2016-09-26 NOTE — Anesthesia Preprocedure Evaluation (Addendum)
Anesthesia Evaluation  Patient identified by MRN, date of birth, ID band Patient awake    Reviewed: Allergy & Precautions, H&P , NPO status , Patient's Chart, lab work & pertinent test results, reviewed documented beta blocker date and time   Airway Mallampati: I  TM Distance: >3 FB Neck ROM: full    Dental no notable dental hx.    Pulmonary neg pulmonary ROS, former smoker,    Pulmonary exam normal breath sounds clear to auscultation       Cardiovascular hypertension, Pt. on medications negative cardio ROS Normal cardiovascular exam Rhythm:regular Rate:Normal     Neuro/Psych negative neurological ROS  negative psych ROS   GI/Hepatic negative GI ROS, Neg liver ROS,   Endo/Other  negative endocrine ROS  Renal/GU negative Renal ROS  negative genitourinary   Musculoskeletal   Abdominal   Peds  Hematology negative hematology ROS (+)   Anesthesia Other Findings HELLP syndrome , third trimester 09/26/2016 . Fetal demise, greater than 22 weeks, antepartum    Reproductive/Obstetrics (+) Pregnancy                             Anesthesia Physical Anesthesia Plan  ASA: III  Anesthesia Plan: Epidural   Post-op Pain Management:    Induction:   PONV Risk Score and Plan:   Airway Management Planned:   Additional Equipment:   Intra-op Plan:   Post-operative Plan:   Informed Consent: I have reviewed the patients History and Physical, chart, labs and discussed the procedure including the risks, benefits and alternatives for the proposed anesthesia with the patient or authorized representative who has indicated his/her understanding and acceptance.     Plan Discussed with:   Anesthesia Plan Comments: (I HAD A LONG DISCUSSION WITH Jancy IN THE PRESENCE OF BOTH HER HUSBAND AND NURSE.  I EXPLAINED THE COMPLEXITIES OF HEELP SYNDROME AND THE FACT THAT SHE HAS ALREADY HAD A PLATELET  TRANSFUSION TO BRING HER TO + 100 K.  THAT SHE HAD SIGNIFICANT RISK OF BLEEDING BUT I WAS WILLING TO PLACE AN EPIDURAL AS LONG AS SHE ACCEPTED THE INCREASED RISKS.  EVERYONE IN THE ROOM HAD A CLEAR UNDERSTANDING,  AND THAT WE WOULD MORE THAN LIKELY BE GIVING HER ADDITIONAL PLATELETS PRIOR TO DISCONTINUATION OF THE EPIDURAL CATHTER.  )       Anesthesia Quick Evaluation

## 2016-09-26 NOTE — Telephone Encounter (Signed)
TC to patient notifying her of Dr. Mont DuttonAnyanwu's request [facility].  Explained that she has HELLP Syndrome and the severity of this condition.  Patient was instructed to not pack anything or delay return to the hospital at this time.  Patient verbalized an understanding of the plan of care and agrees.   Deborah James, Deborah James 09/26/2016, 2:40 AM

## 2016-09-27 ENCOUNTER — Inpatient Hospital Stay (HOSPITAL_COMMUNITY): Payer: 59

## 2016-09-27 ENCOUNTER — Encounter (HOSPITAL_COMMUNITY): Payer: Self-pay | Admitting: Obstetrics

## 2016-09-27 DIAGNOSIS — O364XX0 Maternal care for intrauterine death, not applicable or unspecified: Secondary | ICD-10-CM

## 2016-09-27 DIAGNOSIS — Z3A31 31 weeks gestation of pregnancy: Secondary | ICD-10-CM

## 2016-09-27 DIAGNOSIS — O1424 HELLP syndrome, complicating childbirth: Secondary | ICD-10-CM

## 2016-09-27 LAB — CBC
HCT: 29.3 % — ABNORMAL LOW (ref 36.0–46.0)
HCT: 28.9 % — ABNORMAL LOW (ref 36.0–46.0)
Hemoglobin: 10.5 g/dL — ABNORMAL LOW (ref 12.0–15.0)
Hemoglobin: 10.2 g/dL — ABNORMAL LOW (ref 12.0–15.0)
MCH: 33.3 pg (ref 26.0–34.0)
MCH: 33.1 pg (ref 26.0–34.0)
MCHC: 35.8 g/dL (ref 30.0–36.0)
MCHC: 35.3 g/dL (ref 30.0–36.0)
MCV: 93 fL (ref 78.0–100.0)
MCV: 93.8 fL (ref 78.0–100.0)
PLATELETS: 95 10*3/uL — AB (ref 150–400)
PLATELETS: 104 10*3/uL — AB (ref 150–400)
RBC: 3.15 MIL/uL — AB (ref 3.87–5.11)
RBC: 3.08 MIL/uL — ABNORMAL LOW (ref 3.87–5.11)
RDW: 15.8 % — AB (ref 11.5–15.5)
RDW: 15.9 % — AB (ref 11.5–15.5)
WBC: 13.7 10*3/uL — ABNORMAL HIGH (ref 4.0–10.5)
WBC: 14.8 10*3/uL — AB (ref 4.0–10.5)

## 2016-09-27 LAB — PREPARE PLATELET PHERESIS
Unit division: 0
Unit division: 0
Unit division: 0
Unit division: 0

## 2016-09-27 LAB — COMPREHENSIVE METABOLIC PANEL
ALK PHOS: 124 U/L (ref 38–126)
ALT: 189 U/L — ABNORMAL HIGH (ref 14–54)
ALT: 161 U/L — ABNORMAL HIGH (ref 14–54)
ANION GAP: 7 (ref 5–15)
ANION GAP: 6 (ref 5–15)
AST: 120 U/L — ABNORMAL HIGH (ref 15–41)
AST: 93 U/L — AB (ref 15–41)
Albumin: 2.6 g/dL — ABNORMAL LOW (ref 3.5–5.0)
Albumin: 2.4 g/dL — ABNORMAL LOW (ref 3.5–5.0)
Alkaline Phosphatase: 118 U/L (ref 38–126)
BILIRUBIN TOTAL: 0.7 mg/dL (ref 0.3–1.2)
BUN: 8 mg/dL (ref 6–20)
BUN: 9 mg/dL (ref 6–20)
CALCIUM: 7.4 mg/dL — AB (ref 8.9–10.3)
CHLORIDE: 104 mmol/L (ref 101–111)
CO2: 23 mmol/L (ref 22–32)
CO2: 26 mmol/L (ref 22–32)
Calcium: 7.1 mg/dL — ABNORMAL LOW (ref 8.9–10.3)
Chloride: 102 mmol/L (ref 101–111)
Creatinine, Ser: 0.57 mg/dL (ref 0.44–1.00)
Creatinine, Ser: 0.56 mg/dL (ref 0.44–1.00)
GFR calc Af Amer: >60 mL/min (ref >60)
GFR calc Af Amer: >60 mL/min (ref >60)
GFR calc non Af Amer: >60 mL/min (ref >60)
GLUCOSE: 128 mg/dL — AB (ref 65–99)
Glucose, Bld: 84 mg/dL (ref 65–99)
POTASSIUM: 4.3 mmol/L (ref 3.5–5.1)
Potassium: 4.1 mmol/L (ref 3.5–5.1)
SODIUM: 132 mmol/L — AB (ref 135–145)
SODIUM: 136 mmol/L (ref 135–145)
TOTAL PROTEIN: 6.2 g/dL — AB (ref 6.5–8.1)
Total Bilirubin: 1.1 mg/dL (ref 0.3–1.2)
Total Protein: 6.5 g/dL (ref 6.5–8.1)

## 2016-09-27 LAB — BPAM PLATELET PHERESIS
BLOOD PRODUCT EXPIRATION DATE: 201809112359
BLOOD PRODUCT EXPIRATION DATE: 201809132359
Blood Product Expiration Date: 201809122359
Blood Product Expiration Date: 201809132359
ISSUE DATE / TIME: 201809110452
ISSUE DATE / TIME: 201809110628
ISSUE DATE / TIME: 201809111410
ISSUE DATE / TIME: 201809111541
UNIT TYPE AND RH: 6200
UNIT TYPE AND RH: 8400
Unit Type and Rh: 7300
Unit Type and Rh: 7300

## 2016-09-27 MED ORDER — LACTATED RINGERS IV SOLN
INTRAVENOUS | Status: DC
  Administered 2016-09-27 (×2): via INTRAVENOUS

## 2016-09-27 MED ORDER — MAGNESIUM SULFATE 40 G IN LACTATED RINGERS - SIMPLE
2.0000 g/h | INTRAVENOUS | Status: AC
  Administered 2016-09-27: 2 g/h via INTRAVENOUS
  Filled 2016-09-27: qty 40
  Filled 2016-09-27: qty 500

## 2016-09-27 MED ORDER — OXYCODONE HCL 5 MG PO TABS
5.0000 mg | ORAL_TABLET | ORAL | Status: DC | PRN
  Administered 2016-09-27 (×2): 5 mg via ORAL
  Filled 2016-09-27 (×2): qty 1
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
## 2016-09-27 NOTE — Progress Notes (Signed)
Deborah James is a 31 y.o. G1P0 at 933w6d by LMP admitted for IOL for IUFD.  She has HELLP syndrome and is on Magnesium.  Subjective: Doing well. No concerns.  No headache, vision changes, RUQ/epigastric pain, SOB or chest pain.  Objective: BP 131/69   Pulse 69   Temp 98.9 F (37.2 C) (Oral)   Resp 18   Ht 6\' 1"  (1.854 m)   Wt 118.8 kg (262 lb)   SpO2 100%   BMI 34.57 kg/m  I/O last 3 completed shifts: In: 3979.9 [P.O.:920; I.V.:1947.9; Blood:1112] Out: 3050 [Urine:3050] Total I/O In: 1692.1 [P.O.:284; I.V.:1259; Other:149.1] Out: 1450 [Urine:1450]   UC:   q3 min SVE:   Dilation: 4.5 Effacement (%): 80 Station: 0 Exam by:: Deborah PopperMichelle Williams, RN  Labs: Lab Results  Component Value Date   WBC 13.7 (H) 09/27/2016   HGB 10.5 (L) 09/27/2016   HCT 29.3 (L) 09/27/2016   MCV 93.0 09/27/2016   PLT 95 (L) 09/27/2016    Assessment / Plan: IOL for IUFD, HELLP syndrome  Labor: S/p cytotec and FB; continue pitocin Preeclampsia:  on magnesium sulfate and no signs or symptoms of toxicity, plts stable s/p 4 units transfused Fetal Wellbeing:  N/A Pain Control:  Epidural I/D:  n/a Anticipated MOD:  NSVD  Deborah BeachMary K Mykayla Brinton, DO PGY-2 09/27/2016, 5:35 AM

## 2016-09-27 NOTE — Anesthesia Postprocedure Evaluation (Signed)
Anesthesia Post Note  Patient: Deborah James  Procedure(s) Performed: * No procedures listed *     Anesthesia Type: Epidural    Last Vitals:  Vitals:   09/27/16 0432 09/27/16 0502  BP: 127/78 131/69  Pulse: 70 69  Resp: 18 18  Temp:    SpO2:      Last Pain:  Vitals:   09/27/16 0132  TempSrc: Oral  PainSc:    Pain Goal:                 Clydia Nieves

## 2016-09-27 NOTE — Progress Notes (Signed)
Baby removed from room- hat, dress and blanket returned to mom. Mom tearful at this time.

## 2016-09-27 NOTE — Progress Notes (Signed)
Deborah James is a 31 y.o. G1P0 at 8116w6d admitted for IOL for IUFD.  Subjective: Doing well.  No concerns.  No headache, vision changes, RUQ or epigastric pain.  Objective: BP 139/89   Pulse 68   Temp 98.9 F (37.2 C) (Oral)   Resp 18   Ht 6\' 1"  (1.854 m)   Wt 118.8 kg (262 lb)   SpO2 100%   BMI 34.57 kg/m  I/O last 3 completed shifts: In: 3979.9 [P.O.:920; I.V.:1947.9; Blood:1112] Out: 3050 [Urine:3050] Total I/O In: 1067.1 [P.O.:224; I.V.:750; Other:93.1] Out: 950 [Urine:950]   UC: occasional SVE:   4.5 / 50 / -1  Labs: Lab Results  Component Value Date   WBC 14.3 (H) 09/26/2016   HGB 10.7 (L) 09/26/2016   HCT 30.2 (L) 09/26/2016   MCV 93.2 09/26/2016   PLT 97 (L) 09/26/2016    Assessment / Plan: IOL for IUFD; HELLP syndrome on Magnesium  Labor: S/p cytotec & FB; start pitocin  Preeclampsia:  on magnesium sulfate and no signs or symptoms of toxicity, plts at 97 after 4 units Fetal Wellbeing:  N/A Pain Control:  Epidural I/D:  n/a Anticipated MOD:  NSVD  Deborah BeachMary K Zayveon Raschke, DO PGY-2 09/27/2016, 2:24 AM

## 2016-09-28 LAB — COMPREHENSIVE METABOLIC PANEL
ALBUMIN: 2.3 g/dL — AB (ref 3.5–5.0)
ALT: 162 U/L — AB (ref 14–54)
AST: 103 U/L — AB (ref 15–41)
Alkaline Phosphatase: 115 U/L (ref 38–126)
Anion gap: 7 (ref 5–15)
BUN: 6 mg/dL (ref 6–20)
CHLORIDE: 105 mmol/L (ref 101–111)
CO2: 25 mmol/L (ref 22–32)
Calcium: 7.2 mg/dL — ABNORMAL LOW (ref 8.9–10.3)
Creatinine, Ser: 0.55 mg/dL (ref 0.44–1.00)
GFR calc Af Amer: >60 mL/min (ref >60)
GFR calc non Af Amer: >60 mL/min (ref >60)
Glucose, Bld: 77 mg/dL (ref 65–99)
Potassium: 4.2 mmol/L (ref 3.5–5.1)
SODIUM: 137 mmol/L (ref 135–145)
Total Bilirubin: 0.8 mg/dL (ref 0.3–1.2)
Total Protein: 6 g/dL — ABNORMAL LOW (ref 6.5–8.1)

## 2016-09-28 LAB — CBC
HCT: 29 % — ABNORMAL LOW (ref 36.0–46.0)
Hemoglobin: 10.2 g/dL — ABNORMAL LOW (ref 12.0–15.0)
MCH: 33 pg (ref 26.0–34.0)
MCHC: 35.2 g/dL (ref 30.0–36.0)
MCV: 93.9 fL (ref 78.0–100.0)
PLATELETS: 86 10*3/uL — AB (ref 150–400)
RBC: 3.09 MIL/uL — ABNORMAL LOW (ref 3.87–5.11)
RDW: 16.2 % — ABNORMAL HIGH (ref 11.5–15.5)
WBC: 13.6 10*3/uL — AB (ref 4.0–10.5)
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
## 2016-09-28 NOTE — Progress Notes (Signed)
Post Partum Day 1 s/p  Svd of Nonviable 1069 gm female at 6 39 am 9/12. HELLP syndrome with normal pressures,  Subjective: up ad lib, voiding, tolerating PO and has mild ruq pain when up and about, none at rest. denies h/a, or scotoma.  Objective: Blood pressure 130/80, pulse (!) 59, temperature 98.3 F (36.8 C), temperature source Oral, resp. rate 18, height 6\' 1"  (1.854 m), weight 262 lb (118.8 kg), SpO2 95 %.  Physical Exam:  General: alert, cooperative and no distress Lochia: appropriate Uterine Fundus: firm,at u-3 Incision:  DVT Evaluation: No evidence of DVT seen on physical exam. Reflexes 1+ no clonus   Recent Labs  09/27/16 1656 09/28/16 0557  HGB 10.2* 10.2*  HCT 28.9* 29.0*   CBC Latest Ref Rng & Units 09/28/2016 09/27/2016 09/27/2016  WBC 4.0 - 10.5 K/uL 13.6(H) 14.8(H) 13.7(H)  Hemoglobin 12.0 - 15.0 g/dL 10.2(L) 10.2(L) 10.5(L)  Hematocrit 36.0 - 46.0 % 29.0(L) 28.9(L) 29.3(L)  Platelets 150 - 400 K/uL 86(L) 104(L) 95(L)    CMP Latest Ref Rng & Units 09/28/2016 09/27/2016 09/27/2016  Glucose 65 - 99 mg/dL 77 161(W128(H) 84  BUN 6 - 20 mg/dL 6 9 8   Creatinine 0.44 - 1.00 mg/dL 9.600.55 4.540.56 0.980.57  Sodium 135 - 145 mmol/L 137 136 132(L)  Potassium 3.5 - 5.1 mmol/L 4.2 4.3 4.1  Chloride 101 - 111 mmol/L 105 104 102  CO2 22 - 32 mmol/L 25 26 23   Calcium 8.9 - 10.3 mg/dL 7.2(L) 7.1(L) 7.4(L)  Total Protein 6.5 - 8.1 g/dL 6.0(L) 6.2(L) 6.5  Total Bilirubin 0.3 - 1.2 mg/dL 0.8 0.7 1.1  Alkaline Phos 38 - 126 U/L 115 118 124  AST 15 - 41 U/L 103(H) 93(H) 120(H)  ALT 14 - 54 U/L 162(H) 161(H) 189(H)     Assessment/Plan: HELLP Syndrome, s/p SVD of IUFD at 3833w6d. Normal pressures S/p mag x 24 hr, will check bp's off mag  P Keep til tomorrow, or until labs trend toward normal then early followup gyn clinic here.  Pt does not plan to go back to [place] as she lives in [place], And went there due to convenience to work.   LOS: 2 days   Legacie Dillingham V 09/28/2016, 9:35 AM

## 2016-09-28 NOTE — Consult Note (Signed)
Visited with Mom on post partum day 1 following delivery of nonviable infant.  Mom resting in bed comfortably, among family and friends. Offered condolences along with Lactation After Loss brochure.  Reviewed briefly what she may expect in the next few days.   Encouraged Mom to call our number for any guidance or concerns she has.  Informed Mom that Lactation Department was there for her.  Mom leaned forward and placed [facility] arms [facility] and thanked her.

## 2016-09-29 DIAGNOSIS — E669 Obesity, unspecified: Secondary | ICD-10-CM

## 2016-09-29 LAB — CMV ANTIBODY, IGG (EIA)

## 2016-09-29 LAB — COMPREHENSIVE METABOLIC PANEL
ALK PHOS: 112 U/L (ref 38–126)
ALT: 142 U/L — AB (ref 14–54)
AST: 79 U/L — AB (ref 15–41)
Albumin: 2.4 g/dL — ABNORMAL LOW (ref 3.5–5.0)
Anion gap: 6 (ref 5–15)
BILIRUBIN TOTAL: 0.6 mg/dL (ref 0.3–1.2)
BUN: 7 mg/dL (ref 6–20)
CALCIUM: 7.4 mg/dL — AB (ref 8.9–10.3)
CO2: 23 mmol/L (ref 22–32)
CREATININE: 0.5 mg/dL (ref 0.44–1.00)
Chloride: 109 mmol/L (ref 101–111)
GFR calc Af Amer: >60 mL/min (ref >60)
Glucose, Bld: 72 mg/dL (ref 65–99)
POTASSIUM: 4.2 mmol/L (ref 3.5–5.1)
Sodium: 138 mmol/L (ref 135–145)
TOTAL PROTEIN: 5.9 g/dL — AB (ref 6.5–8.1)

## 2016-09-29 LAB — CBC
HEMATOCRIT: 27.7 % — AB (ref 36.0–46.0)
HEMOGLOBIN: 9.7 g/dL — AB (ref 12.0–15.0)
MCH: 33.1 pg (ref 26.0–34.0)
MCHC: 35 g/dL (ref 30.0–36.0)
MCV: 94.5 fL (ref 78.0–100.0)
Platelets: 110 10*3/uL — ABNORMAL LOW (ref 150–400)
RBC: 2.93 MIL/uL — ABNORMAL LOW (ref 3.87–5.11)
RDW: 16.4 % — AB (ref 11.5–15.5)
WBC: 13.2 10*3/uL — ABNORMAL HIGH (ref 4.0–10.5)

## 2016-09-29 LAB — RUBELLA SCREEN: Rubella: 1.48 index (ref >0.99)

## 2016-09-29 LAB — HSV 1 ANTIBODY, IGG: HSV 1 Glycoprotein G Ab, IgG: 38.1 index — ABNORMAL HIGH (ref 0.00–0.90)

## 2016-09-29 LAB — TOXOPLASMA GONDII ANTIBODY, IGG

## 2016-09-29 LAB — HSV 2 ANTIBODY, IGG: HSV 2 Glycoprotein G Ab, IgG: 5.31 index — ABNORMAL HIGH (ref 0.00–0.90)

## 2016-09-29 MED ORDER — AMLODIPINE BESYLATE 10 MG PO TABS
10.0000 mg | ORAL_TABLET | Freq: Every day | ORAL | Status: DC
  Administered 2016-09-29 – 2016-09-30 (×2): 10 mg via ORAL
  Filled 2016-09-29 (×2): qty 1

## 2016-09-29 NOTE — Progress Notes (Signed)
Daily Post Partum Note  Deborah James is a 31 y.o. G1P0100 HD#4/PPD#2 s/p  SVD/intact perineum  @ [redacted]w[redacted]d.  Pregnancy c/b IUFD, HELLP, BMI 35 24hr/overnight events:  Pt started on norvasc this AM  Subjective:  No s/s of pre-x. Mood seems good  Objective:    Current Vital Signs 24h Vital Sign Ranges  T 98.7 F (37.1 C) Temp  Avg: 98.8 F (37.1 C)  Min: 98.2 F (36.8 C)  Max: 99.4 F (37.4 C)  BP (!) 143/96 BP  Min: 127/84  Max: 149/91  HR 71 Pulse  Avg: 76.5  Min: 71  Max: 80  RR 18 Resp  Avg: 19  Min: 18  Max: 20  SaO2 98 % Not Delivered SpO2  Avg: 95.5 %  Min: 94 %  Max: 98 %       24 Hour I/O Current Shift I/O  Time Ins Outs 09/13 0701 - 09/14 0700 In: 600 [P.O.:600] Out: 2000 [Urine:2000] 09/14 0701 - 09/14 1900 In: -  Out: 1800 [Urine:1800]   Patient Vitals for the past 24 hrs:  BP Temp Temp src Pulse Resp SpO2  09/29/16 1000 (!) 143/96 - - - - -  09/29/16 0800 127/84 98.7 F (37.1 C) Oral 71 18 98 %  09/29/16 0005 (!) 146/77 98.2 F (36.8 C) Oral 79 18 94 %  09/28/16 2012 (!) 143/92 99.4 F (37.4 C) Oral 80 20 94 %  09/28/16 1600 (!) 149/91 98.7 F (37.1 C) Oral 76 20 96 %   General: NAD Abdomen: obese, soft, nttp. Firm fundus below the umbilicus Perineum: deferred Skin:  Warm and dry.  Cardiovascular: S1, S2 normal, no murmur, rub or gallop, regular rate and rhythm Respiratory:  Clear to auscultation bilateral. Normal respiratory effort Extremities: no c/c/e  Medications Current Facility-Administered Medications  Medication Dose Route Frequency Provider Last Rate Last Dose  . amLODipine (NORVASC) tablet 10 mg  10 mg Oral Daily Hermina Staggers, MD   10 mg at 09/29/16 1031  . hydrALAZINE (APRESOLINE) injection 10 mg  10 mg Intravenous Once PRN Anyanwu, Ugonna A, MD      . labetalol (NORMODYNE,TRANDATE) injection 20-80 mg  20-80 mg Intravenous Q10 min PRN Anyanwu, Ugonna A, MD      . oxyCODONE (Oxy IR/ROXICODONE) immediate release tablet 5 mg  5 mg Oral Q4H  PRN Lennox Solders, MD   5 mg at 09/27/16 2252    Labs:   Recent Labs Lab 09/27/16 1656 09/28/16 0557 09/29/16 0549  WBC 14.8* 13.6* 13.2*  HGB 10.2* 10.2* 9.7*  HCT 28.9* 29.0* 27.7*  PLT 104* 86* 110*    Recent Labs Lab 09/27/16 1656 09/28/16 0557 09/29/16 0549  NA 136 137 138  K 4.3 4.2 4.2  CL 104 105 109  CO2 BUN CREATININE 0.56 0.55 0.50  GLUCOSE 128* 77 72  CALCIUM 7.1* 7.2* 7.4*    Assessment & Plan:  Pt doing well *Postpartum/postop: routine care. *HELLP: improving. Will keep till tomorrow since started on BP meds today *IUFD: seen by chaplain services *Dispo: likely tomorrow  B POS / Rubella Immune / Varicella Unknown/  RPR negative / HIV negative / HepBsAg unknown / Tdap UTD: unknown/ pap status: unknown  / Contraception: not d/w pt / Follow up: pt desires to follow up with [place]. inbasket message sent for 9/21 mood and bp check with provider for [facility] clinic.   Cornelia Copa MD Attending Center for [employer] (  Water engineer)

## 2016-09-29 NOTE — Consult Note (Signed)
Lactation consult with this first time mom who had a IUFD. Mom was already seen by lactation, but I wanted to check on her prior to discharge today. I reviewed engorgemntn care , if needed .Mom reports she does not feel any breast changes at this time. .I told mom she was able to have WIC, and she appreciated me offering to fax her information to [facility] for her, which I did. Mom knows to call for questions/conerns.

## 2016-09-29 NOTE — Progress Notes (Signed)
Deborah James continues to be well-supported by her family.  She was more tearful today as she prepares to go home.  We talked about how to care for herself in the coming days and she has her resources for follow up support, including calling us.  Her mother will be with her for another week and she is grateful for that.  Chaplain Dyanne Carrel, Bcc Pager, [phone] 1:58 PM    09/29/16 1300  Clinical Encounter Type  Visited With Patient and family together  Visit Type Spiritual support

## 2016-09-30 LAB — CBC
HCT: 29.6 % — ABNORMAL LOW (ref 36.0–46.0)
HEMOGLOBIN: 10.3 g/dL — AB (ref 12.0–15.0)
MCH: 33.6 pg (ref 26.0–34.0)
MCHC: 34.8 g/dL (ref 30.0–36.0)
MCV: 96.4 fL (ref 78.0–100.0)
Platelets: 158 10*3/uL (ref 150–400)
RBC: 3.07 MIL/uL — ABNORMAL LOW (ref 3.87–5.11)
RDW: 16.5 % — AB (ref 11.5–15.5)
WBC: 12.6 10*3/uL — ABNORMAL HIGH (ref 4.0–10.5)

## 2016-09-30 LAB — BPAM RBC
BLOOD PRODUCT EXPIRATION DATE: 201809292359
Blood Product Expiration Date: 201809232359
Unit Type and Rh: 5100
Unit Type and Rh: 5100

## 2016-09-30 LAB — COMPREHENSIVE METABOLIC PANEL
ALK PHOS: 119 U/L (ref 38–126)
ALT: 150 U/L — ABNORMAL HIGH (ref 14–54)
ANION GAP: 9 (ref 5–15)
AST: 86 U/L — ABNORMAL HIGH (ref 15–41)
Albumin: 2.4 g/dL — ABNORMAL LOW (ref 3.5–5.0)
BUN: 10 mg/dL (ref 6–20)
CALCIUM: 8.6 mg/dL — AB (ref 8.9–10.3)
CO2: 21 mmol/L — AB (ref 22–32)
Chloride: 106 mmol/L (ref 101–111)
Creatinine, Ser: 0.59 mg/dL (ref 0.44–1.00)
GFR calc non Af Amer: >60 mL/min (ref >60)
GLUCOSE: 78 mg/dL (ref 65–99)
POTASSIUM: 4.8 mmol/L (ref 3.5–5.1)
SODIUM: 136 mmol/L (ref 135–145)
Total Bilirubin: 0.4 mg/dL (ref 0.3–1.2)
Total Protein: 6 g/dL — ABNORMAL LOW (ref 6.5–8.1)

## 2016-09-30 LAB — TYPE AND SCREEN
ABO/RH(D): B POS
Antibody Screen: NEGATIVE
Unit division: 0
Unit division: 0

## 2016-09-30 MED ORDER — AMLODIPINE BESYLATE 10 MG PO TABS: 10.0000 mg | ORAL_TABLET | Freq: Every day | ORAL | 0 refills | Status: DC

## 2016-09-30 MED ORDER — IBUPROFEN 600 MG PO TABS: 600.0000 mg | ORAL_TABLET | Freq: Four times a day (QID) | ORAL | 3 refills | Status: DC | PRN

## 2016-09-30 NOTE — Progress Notes (Signed)

## 2016-09-30 NOTE — Discharge Summary (Signed)
OB Discharge Summary     Patient Name: Deborah James DOB: 1985/10/05 MRN: [id number]  Date of admission: 09/26/2016 Delivering MD: Shawna Clamp R   Date of discharge: 09/30/2016  Admitting diagnosis: 31 WEEKS FETAL DEMISE Intrauterine pregnancy: [redacted]w[redacted]d     Secondary diagnosis:  Principal Problem:   Fetal demise, greater than 22 weeks, antepartum Active Problems:   HELLP syndrome (HELLP), third trimester   Obesity (BMI 30-39.9)   Discharge diagnosis: Preterm Pregnancy Delivered, Preeclampsia (severe) and HELLP, IUFD                                                                                               Hospital course:  Induction of Labor With Vaginal Delivery   31 y.o. yo G1P0100 at [redacted]w[redacted]d was admitted to the hospital 09/26/2016 for induction of labor secondary to IUFD and HELLP syndrome.  Patient had an uncomplicated labor course as follows: Membrane Rupture Time/Date:   ,    Intrapartum Procedures: Episiotomy: None [1]                                         Lacerations:  None [1]  Patient had delivery of a Non Viable infant.  Information for the patient's newborn:  Star, Resler [[id number]]  Delivery Method: Vaginal, Spontaneous Delivery (Filed from Delivery Summary)   09/27/2016  Details of delivery can be found in separate delivery note.  Patient had a routine postpartum course. Patient is discharged home 09/30/16.  Patient received 4 units pf platelets and magnesium sulfate. Patient discharged home on Norvasc  Physical exam  Vitals:   09/29/16 2027 09/29/16 2338 09/30/16 0423 09/30/16 0841  BP: 140/87 137/78 (!) 146/94 131/76  Pulse:  70 71 69  Resp:  Temp:  98.3 F (36.8 C) 98.2 F (36.8 C) 98.4 F (36.9 C)  TempSrc:  Oral Oral Oral  SpO2:  100%  100%  Weight:      Height:       General: alert, cooperative and no distress Lochia: appropriate Uterine Fundus: firm DVT Evaluation: No evidence of DVT seen on physical exam. Negative  Homan's sign. Labs: Lab Results  Component Value Date   WBC 12.6 (H) 09/30/2016   HGB 10.3 (L) 09/30/2016   HCT 29.6 (L) 09/30/2016   MCV 96.4 09/30/2016   PLT 158 09/30/2016   CMP Latest Ref Rng & Units 09/30/2016  Glucose 65 - 99 mg/dL 78  BUN 6 - 20 mg/dL 10  Creatinine 7.25 - 3.66 mg/dL 4.40  Sodium 347 - 425 mmol/L 136  Potassium 3.5 - 5.1 mmol/L 4.8  Chloride 101 - 111 mmol/L 106  CO2 22 - 32 mmol/L 21(L)  Calcium 8.9 - 10.3 mg/dL 9.5(G)  Total Protein 6.5 - 8.1 g/dL 6.0(L)  Total Bilirubin 0.3 - 1.2 mg/dL 0.4  Alkaline Phos 38 - 126 U/L 119  AST 15 - 41 U/L 86(H)  ALT 14 - 54 U/L 150(H)    Discharge instruction: per After Visit Summary and "  Baby and Me Booklet".  After visit meds:  Allergies as of 09/30/2016   No Known Allergies     Medication List    STOP taking these medications   DICLEGIS 10-10 MG Tbec Generic drug:  Doxylamine-Pyridoxine     TAKE these medications   amLODipine 10 MG tablet Commonly known as:  NORVASC Take 1 tablet (10 mg total) by mouth daily.   ibuprofen 600 MG tablet Commonly known as:  ADVIL,MOTRIN Take 1 tablet (600 mg total) by mouth every 6 (six) hours as needed.   prenatal vitamin w/FE, FA 27-1 MG Tabs tablet Take 1 tablet by mouth daily at 12 noon.            Discharge Care Instructions        Start     Ordered   10/01/16 0000  amLODipine (NORVASC) 10 MG tablet  Daily     09/30/16 0846   09/30/16 0000  ibuprofen (ADVIL,MOTRIN) 600 MG tablet  Every 6 hours PRN     09/30/16 0846   09/26/16 0000  OB RESULTS CONSOLE GC/Chlamydia    Comments:  This external order was created through the Results Console.   09/26/16 0432   09/26/16 0000  OB RESULTS CONSOLE RPR    Comments:  This external order was created through the Results Console.    09/26/16 0432   09/26/16 0000  OB RESULTS CONSOLE HIV antibody    Comments:  This external order was created through the Results Console.    09/26/16 0432   09/26/16 0000  OB  RESULTS CONSOLE Rubella Antibody    Comments:  This external order was created through the Results Console.    09/26/16 0432   09/26/16 0000  OB RESULTS CONSOLE Varicella zoster antibody, IgG    Comments:  This external order was created through the Results Console.    09/26/16 0432   09/26/16 0000  OB RESULTS CONSOLE Hepatitis B surface antigen    Comments:  This external order was created through the Results Console.    09/26/16 0432   09/26/16 0000  OB RESULTS CONSOLE ABO/Rh    Comments:  This external order was created through the Results Console.    09/26/16 0432   09/26/16 0000  OB RESULTS CONSOLE Hemoglobin and hematocrit, blood    Comments:  This external order was created through the Results Console.    09/26/16 0432   09/26/16 0000  OB RESULTS CONSOLE PLATELET COUNT    Comments:  This external order was created through the Results Console.    09/26/16 0432      Diet: routine diet  Activity: Advance as tolerated. Pelvic rest for 6 weeks.   Outpatient follow up: Will be scheduled in 1 week for BP check and 4-6 weeks for postpartum Follow up Appt:No future appointments. Follow up Visit:No Follow-up on file.  Postpartum contraception: Undecided  Newborn Data: Live born unspecified sex  Birth Weight: 2 lb 5.7 oz (1069 g) APGAR: 0, 0    09/30/2016 Catalina Antigua, MD

## 2016-09-30 NOTE — Discharge Instructions (Signed)
HELLP Syndrome °HELLP syndrome is a life-threatening liver disorder thought to be a type of severe preeclampsia during pregnancy. Preeclampsia is a disorder of pregnancy that causes high blood pressure and protein in the urine. It develops after the 20th week of pregnancy. The name HELLP stands for: °· H - hemolytic anemia, hemolysis (destruction of blood cells). °· EL - elevated liver enzymes (sign of liver damage). °· LP - low platelet count (blood cells that help stop bleeding). ° °HELLP syndrome often occurs without warning and can be difficult to recognize. In most cases, HELLP syndrome occurs before 35 weeks of pregnancy, but it can also develop right after childbirth. It can be fatal to both the mother and baby. °What are the causes? °The cause of this condition is not known at this time. °What are the signs or symptoms? °Symptoms of this condition include: °· Headache. °· Blurry vision. °· Pain in the upper right abdomen. °· Shoulder, neck, and upper body pain. °· Fatigue. °· Feeling sick. °· Seizures. °· Extreme weight gain or swelling. ° °How is this diagnosed? °This condition is diagnosed based on results of a blood test, which include: °· A complete blood count. °· Liver enzymes test (liver function tests). °· Kidney function test. °· Measurement of salts and other chemicals in your blood (electrolytes). °· Blood coagulation tests. ° °How is this treated? °· This condition is treated by delivering your baby as soon as possible. This may be done by giving you medicines to start contractions (induction of labor) or cesarean delivery. °· Before delivery, you can be treated for a short time with an injection of magnesium sulphate. This medicine reduces muscle contractions and blocks the impulse from nerves to the muscles, which helps prevent seizures. °· Medicines to lower and control blood pressure may also be used. Steroid hormones (corticosteroids) may be given to help your baby's lungs mature  faster. °· Your health care provider may recommend that you take one low-dose aspirin (81 mg) each day to help prevent high blood pressure during your pregnancy if you are at risk for preeclampsia. You may be at risk for preeclampsia if: °? You had preeclampsia or eclampsia (high blood pressure with seizures) during a previous pregnancy. °? Your baby did not grow as expected during a previous pregnancy. °? You experienced preterm birth with a previous pregnancy. °? You experienced a separation of the placenta from the uterus (placental abruption) during a previous pregnancy. °? You experienced the loss of your baby during a previous pregnancy. °? You are pregnant with more than one baby. °? You have other medical conditions (such as diabetes or autoimmune disease). °· Continuous medical management and monitoring of you and your baby is needed. This is true during pregnancy, during labor, during delivery, and after delivery (postpartum). A blood transfusion may be required if bleeding problems become severe. °Get help right away if: °You have symptoms of HELLP syndrome during your pregnancy. °· You may need to call your local emergency services (911 in [place] to get to the hospital as soon as possible. °· Do not drive yourself to the hospital. ° °This information is not intended to replace advice given to you by your health care provider. Make sure you discuss any questions you have with your health care provider. °Document Released: 04/10/2006 Document Revised: 06/10/2015 Document Reviewed: 06/20/2012 °Elsevier Interactive Patient Education © 2017 [employer] Inc[employer] ° °

## 2016-10-02 ENCOUNTER — Telehealth: Payer: Self-pay | Admitting: General Practice

## 2016-10-02 NOTE — Telephone Encounter (Signed)
Called and notified patient of appointment on 10/06/16 at 10:20am with Dr. Marice Potter.  Patient verbalized understanding.

## 2016-10-03 LAB — ANTIPHOSPHOLIPID SYNDROME EVAL, BLD
Anticardiolipin IgA: <9 APL U/mL (ref 0–11)
Anticardiolipin IgG: <9 GPL U/mL (ref 0–14)
DRVVT: 40.6 s (ref 0.0–47.0)
PHOSPHATYDALSERINE, IGA: 1 {APS'U} (ref 0–20)
PHOSPHATYDALSERINE, IGG: 2 {GPS'U} (ref 0–11)
PHOSPHATYDALSERINE, IGM: 4 {MPS'U} (ref 0–25)
PTT LA: 38.6 s (ref 0.0–51.9)

## 2016-10-06 ENCOUNTER — Encounter: Payer: Self-pay | Admitting: Obstetrics & Gynecology

## 2016-10-06 ENCOUNTER — Ambulatory Visit: Payer: 59 | Admitting: Obstetrics & Gynecology

## 2016-10-06 VITALS — BP 140/91 | HR 70 | Ht 73.0 in | Wt 257.7 lb

## 2016-10-06 DIAGNOSIS — F4321 Adjustment disorder with depressed mood: Secondary | ICD-10-CM

## 2016-10-06 DIAGNOSIS — Z0000 Encounter for general adult medical examination without abnormal findings: Secondary | ICD-10-CM

## 2016-10-06 NOTE — Progress Notes (Signed)
   Subjective:    Patient ID: Deborah James, female    DOB: 1985/10/17, 31 y.o.   MRN: [id number]  HPI 31 yo AA P1 here 10 days after a IUFD due to HELLP syndrome. She is here for a BP check. She scored an 11 on the depression screen.   Review of Systems     Objective:   Physical Exam Breathing, conversing, and ambulating normally Well nourished, well hydrated black female, no apparent distress     Assessment & Plan:  Preventative health care- refer to fam med Grief - she will see Asher Muir soon HTN- doing well on norvasc 10 mg RTC 3 weeks

## 2016-10-27 ENCOUNTER — Encounter: Payer: Self-pay | Admitting: Obstetrics & Gynecology

## 2016-10-27 ENCOUNTER — Other Ambulatory Visit: Payer: Self-pay | Admitting: Obstetrics and Gynecology

## 2016-10-27 ENCOUNTER — Ambulatory Visit (INDEPENDENT_AMBULATORY_CARE_PROVIDER_SITE_OTHER): Payer: 59 | Admitting: Obstetrics & Gynecology

## 2016-10-27 NOTE — Progress Notes (Signed)
Subjective:     Deborah James is a 31 y.o. female who presents for a postpartum visit. She is 4 weeks postpartum following a spontaneous vaginal delivery. I have fully reviewed the prenatal and intrapartum course. The delivery was at 31 gestational weeks. Outcome: spontaneous vaginal delivery. Anesthesia: epidural. Postpartum course has been complicated by using BP med after stillbirth and HELLP.  Bleeding no bleeding. Bowel function is normal. Bladder function is normal. Patient is not sexually active. Contraception method is none. Postpartum depression screening: negative.  The following portions of the patient's history were reviewed and updated as appropriate: allergies, current medications, past family history, past medical history, past social history, past surgical history and problem list.  Review of Systems Pertinent items are noted in HPI.   Objective:    There were no vitals taken for this visit.  General:  alert, cooperative and no distress           Abdomen: non distended   Vulva:  not evaluated  Vagina: not evaluated  Cervix:  not evaluated   Assessment:     Normal postpartum exam. Pap smear not done at today's visit.   Plan:    1. Contraception: abstinence 2. Her prenatal care was at [facility] but will transfer here. She needs a PCP. Finish amlodipine and return for BP check 3. Follow up in: 1 weeks or as needed.    Adam Phenix, MD

## 2016-10-27 NOTE — Patient Instructions (Signed)
Vaginal Delivery, Care After °Refer to this sheet in the next few weeks. These instructions provide you with information on caring for yourself after your delivery. Your health care provider may also give you more specific instructions. Your treatment has been planned according to current medical practices, but problems sometimes occur. Call your health care provider if you have any problems or questions after your delivery. °What to expect after your delivery °After your delivery, it is typical to have the following: °· You may feel pain in the vaginal area for several days after delivery. If you had an incision or a vaginal tear, the area will probably continue to be tender to the touch for several weeks. °· You may feel very fatigued after a vaginal delivery. °· You may have vaginal bleeding and discharge that will start out red, then become pink, then yellow, then white. Altogether, this usually lasts for about 6 weeks. °· The combination of having lost your baby and changing hormones from the delivery can make you feel very sad. You may also experience emotions that change very quickly. Some of the emotions people often notice after loss include: °? Anger. °? Denial. °? Guilt. °? Sorrow. °? Depression. °? Grief. °? Relationship problems. ° °Follow these instructions at home: °· Consider seeking support for your loss. Some forms of support that you might consider include your religious leader, friends, family, a professional counselor, or a bereavement support group. °· Take medicines only as directed by your health care provider. °· Continue to use good perineal care. Good perineal care includes: °? Wiping your perineum from front to back. °? Keeping your perineum clean. °· Do not use tampons or douche until your health care provider says it is okay. °· Shower, wash your hair, and take tub baths as directed by your health care provider. °· Wear a well-fitting bra that provides breast support. °· Drink enough  fluids to keep your urine clear or pale yellow. °· Eat healthy foods. °· Eat high-fiber foods every day, such as whole grain cereals and breads, brown rice, beans, and fresh fruits and vegetables. These foods may help prevent or relieve constipation. °· Follow your health care provider's directions about resuming activities such as climbing stairs, driving, lifting, exercising, or traveling. °· Increase your activities gradually. °· Talk to your health care provider about resuming sexual activities. This depends on your risk of infection, your rate of healing, and your comfort and desire to resume sexual activity. °· Try to have someone help you with your household activities for at least a few days after you leave the hospital. °· Rest as much as possible. °· Keep all of your scheduled postpartum appointments. It is very important to keep your scheduled follow-up appointments. At these appointments, your health care provider will be checking to make sure that you are healing physically and emotionally. °· Do not drink alcohol, especially if you are taking medicine to relieve pain. °· Do not use any tobacco products including cigarettes, chewing tobacco, or electronic cigarettes. If you need help quitting, ask your health care provider. °· Do not use illegal drugs. °Contact a health care provider if: °· You feel sad or depressed. °· You have thoughts of hurting yourself. °· You are having trouble eating or sleeping. °· You cannot enjoy the things in life you have previously enjoyed. °· You are passing large clots from your vagina. Save any clots to show your health care provider. °· You have a bad smelling discharge from your vagina. °·   You have trouble urinating. °· You are urinating frequently. °· You have pain when you urinate. °· You have a change in your bowel movements. °· You have increasing redness, pain, or swelling near your incision or vaginal tear. °· You have pus draining from your incision or vaginal  tear. °· Your incision or vaginal tear is separating. °· You have painful, hard, or reddened breasts. °· You have a severe headache. °· You have blurred vision or see spots. °· You are dizzy or light-headed. °· You have a rash. °· You have nausea or vomiting. °· You have not had a menstrual period by the 12th week after delivery. °· You have a fever. °Get help right away if: °· You are concerned that you may hurt yourself or you are considering suicide. °· You have persistent pain. °· You have chest pain. °· You have shortness of breath. °· You faint. °· You have leg pain. °· You have stomach pain. °· Your vaginal bleeding saturates two or more sanitary pads in 1 hour. °This information is not intended to replace advice given to you by your health care provider. Make sure you discuss any questions you have with your health care provider. °Document Released: 05/19/2013 Document Revised: 06/10/2015 Document Reviewed: 02/20/2013 °Elsevier Interactive Patient Education © 2018 [employer] ° °

## 2016-11-03 ENCOUNTER — Ambulatory Visit (INDEPENDENT_AMBULATORY_CARE_PROVIDER_SITE_OTHER): Payer: 59 | Admitting: General Practice

## 2016-11-03 VITALS — BP 127/66 | HR 51 | Ht 73.0 in | Wt 264.0 lb

## 2016-11-03 DIAGNOSIS — Z0130 Encounter for examination of blood pressure without abnormal findings: Secondary | ICD-10-CM

## 2016-11-03 NOTE — Progress Notes (Signed)
Patient here for BP check today. Patient denies headaches, dizziness, or blurry vision. Stopped BP medication Monday per Dr Debroah LoopArnold- this is a follow up to check pressure's after coming off medicine. Per Vonzella NippleJulie Wenzel, patient is okay to remain off meds. Discussed with patient

## 2016-11-06 NOTE — Progress Notes (Signed)
Agree with nursing staff's documentation of this patient's clinic encounter.  Vonzella NippleJulie Wenzel, PA-C 11/06/2016 8:47 AM

## 2017-01-30 ENCOUNTER — Other Ambulatory Visit (HOSPITAL_COMMUNITY): Payer: Self-pay | Admitting: *Deleted

## 2017-01-30 ENCOUNTER — Ambulatory Visit (HOSPITAL_COMMUNITY)
Admission: RE | Admit: 2017-01-30 | Discharge: 2017-01-30 | Disposition: A | Discharge status: Home / Self Care | Payer: Medicaid Other | Source: Ambulatory Visit | Attending: Obstetrics and Gynecology | Admitting: Obstetrics and Gynecology

## 2017-01-30 ENCOUNTER — Encounter (HOSPITAL_COMMUNITY): Payer: Self-pay | Admitting: *Deleted

## 2017-01-30 DIAGNOSIS — Z8759 Personal history of other complications of pregnancy, childbirth and the puerperium: Secondary | ICD-10-CM

## 2017-01-30 HISTORY — DX: Essential (primary) hypertension: I10

## 2017-01-30 NOTE — Consult Note (Addendum)
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  I to her that patients with a diagnosis of preeclampsia or imitators of preeclampsia like HELLP syndrome have increased risk for hypertensive disorders in subsequent pregnancies. The chances of recurrence decrease as the likelihood of true preeclampsia increases.   Some women are notably normotensive between pregnancies but have recurrent preeclampsia. The risk of such recurrence is increased when preeclampsia occurs in the late second or early third trimester. The recurrence of severe preeclampsia, HELLP, or eclampsia in one pregnancy predicts its likely recurrence in subsequent pregnancies. Data from classic series indicate that the likelihood of recurrent preeclampsia is influenced by the certainty of the clinical diagnosis in the first pregnancy ranging from 45-70%. The recurrence of severe preeclampsia or eclampsia in one pregnancy predicts its likely recurrence in subsequent pregnancies. However in this patient's prior pregnancy the likely diagnosis of HELLP increases risk of recurrence to 5-6% and the risk of severe preeclampsia approaches 30%.  Since the preeclamptic syndrome was first recognized, prevention has been attempted. Sodium restriction and nutrient supplements have been unsuccessful. Randomized, controlled trials based on several hypotheses for preventing preeclampsia have been performed including trial utilizing antioxidants, calcium supplementation, and antiplatelet agents. A meta-analysis of trials that enrolled a large number of pregnant women found benefit for aspirin to reduce the frequency of the diagnosis of preeclampsia, preterm delivery, and growth-restricted infants. There was a modest reduction of 17% in the incidence of preeclampsia and there was a 14% reduction in the rate of fetal  and neonatal deaths with the greatest effect when treatment is initiated prior to [redacted] weeks gestation. Antiplatelet agents such as aspirin possibly have moderate benefits when used for prevention of preeclampsia and its consequences.  I recommend that she begin Aspirin 81mg  po qd once she has an ultrasound verifying there is a viable fetus (fetus with normal heart beat) in the uterus and to continue so until she is 37 weeks.  This will also reduce her risk for IUGR and recurrence of IUFD.  I then shifted our discussion to her history of IUFD (intrauterine fetal demise). In such setting we recommend screening for the antiphospholipid antibodies.  A complete panel would include lupus anticoagulant, anticardiolipin antibodies, anti-beta 2 glycoprotein 1 antibodies.  I explained to your patient the presence of these antibodies on 2 laboratory test panels at least 12 weeks apart in conjunction with an adverse pregnancy outcome, in this case being intrauterine fetal demise, would be diagnostic for antiphospholipid syndrome.  Accordingly, your patient had a negative APS workup in 2018.  In setting of hx of IUFD with a negative APS workup, I recommend ultrasound for viability (patient indicated this is arranged in your office); aspirin 81mg  po qd to begin after establishing viable IUP; anatomic survey at 18 weeks of gestational age; interval growth ultrasounds every 4 weeks throughout the mid and late trimesters of pregnancy beginning at [redacted] weeks gestation; either weekly BPP or twice weekly nonstress test and weekly amniotic fluid indices beginning at 32 weeks of gestation and continuing until delivery; and delivery by 80 weeks of gestational age, provided no IUGR, preeclampsia/HELLP, or other indication for delivery arises.  Obesity (BMI > 30) and overweight (25 < BMI < 30) afflicts over 67% of Americans and is the fastest growing health problem in the [place]. Obesity in pregnancy is  associated with an increased  risk of both early fetal loss and stillbirth. In a [employer] database, the risk of stillbirth was 1/200 for non-obese mothers and increases to 1/100 for morbid obesity (BMI > 40). The reason for this increased still-birth risk with advancing gestation is likely multifactorial, but associated with placental dysfunction.  The obese gravida is also at increased risk for preeclampsia, gestational or overt diabetes, other endocrinopathies (hypothyroidism), anesthetic complications related to sleep apnea, psychiatric disease including depression, risk for dysfunctional labor leading to a higher Cesarean delivery rate, and increased risk for thromboembolic disease.    Therefore, we recommend,evaluation: thyroid function, preeclampsia "panel" with 24 hour urine, first trimester aneuploidy screening.  Serial compression devices during labor/post-partum until ambulating (versus lovenox or heparin) and limited weight gain during gestation should also be recommended. If possible ideal body weight should be achieved prior to gestation to reduce the risk of suboptimal outcomes.  I referred her to the IOM guidelines of 11-20 lb weight gain, the ACOG patient education website for nutrition during pregnanc (PreviewPal.plhttps://www.acog.org/Patients/FAQs/Nutrition-During-Pregnancy#how), and the website:  https://ball-collins.biz/www.choosemyplate.gov.     With an increased risk of Pre-eclampsia, and the possibility of pre=existing kidney dysfunction I would recommend having a baseline 24hr urine protein and creatinine.  Labs should be reviewed to evaluate for normal renal function and normal liver function testing as well. Additionally I would recommend serial evaluation of fetal growth every four weeks and fetal well being after [redacted] weeks gestation with twice weekly fetal non-stress testing. Consider delivery by [redacted] weeks gestation.  Summary of Recommendations: 1. Viability ultrasound 2. First trimester screening 3. Initiation of aspirin 81mg  po qd upon  confirming viable IUP 4. Baseline preeclampsia labs/24 hour urine protein/TSH/HgbA1c in setting of obesity/hx HELLP/hx IUFD. 5. Fetal anatomy at 18 weeks 6. Serial growth ultrasounds 22wks to delivery 7. Antenatal testing at 32 weeks until delivery 8. Preeclampsia precautions at/beyond 20 weeks 9. Fetal kick counts in the late trimester 10. Delivery at 39 weeks if not indicated sooner.  Time Spent: I spent in excess of 60 minutes in consultation with this patient to review records, evaluate her case, and provide her with an adequate discussion and education.  More than 50% of this time was spent in direct face-to-face counseling.  It was a pleasure seeing your patient in the office today.  Thank you for consultation. Please do not hesitate to contact our service for any further questions.   Thank you,  Louann SjogrenJeffrey Morgan Gaynelle Arabianenney   Dawnielle Christiana, Louann SjogrenJeffrey Morgan, MD, MS, FACOG Assistant Professor Section of Maternal-Fetal Medicine [facility]

## 2017-02-20 ENCOUNTER — Other Ambulatory Visit: Payer: Self-pay | Admitting: Obstetrics & Gynecology

## 2017-02-20 ENCOUNTER — Other Ambulatory Visit: Payer: Self-pay

## 2017-02-20 ENCOUNTER — Inpatient Hospital Stay (HOSPITAL_COMMUNITY)
Admission: AD | Admit: 2017-02-20 | Discharge: 2017-02-20 | Disposition: A | Discharge status: Home / Self Care | Payer: Managed Care, Other (non HMO) | Source: Ambulatory Visit | Attending: Obstetrics and Gynecology | Admitting: Obstetrics and Gynecology

## 2017-02-20 DIAGNOSIS — O039 Complete or unspecified spontaneous abortion without complication: Secondary | ICD-10-CM | POA: Diagnosis not present

## 2017-02-20 NOTE — MAU Note (Signed)
VSS. Pain 0/10, bleeding precautions reviewed with patient. Discharge complete.

## 2017-02-20 NOTE — MAU Note (Signed)
Pt. Stable with no active bleeding, smears of blood on peripad.  Pain 4/10 with small amount of abd. Cramping.  Pt[facility]

## 2017-02-20 NOTE — MAU Note (Signed)
Deborah James is a 32 y.o. female presenting for from the office for incomplete miscarriage. By LMP is 10 weeks, by TVUS yesterday is 5 weeks. Seen by Dr Mora ApplPinn today in the office with active bleeding, some products removed from the os but continued bleeding. Patient was sent over for possible D&E. While in the waiting area, increased cramping to 7/10. Currently, reports only mild menstrual cramping. History OB History    Gravida Para Term Preterm AB Living   2 1   1        SAB TAB Ectopic Multiple Live Births         0       Past Medical History:  Diagnosis Date  . Asthma   . Chicken pox    As a child  . Fetal demise in singleton pregnancy greater than [redacted] weeks gestation, antepartum 09/26/2016  . Genital HSV   . GERD (gastroesophageal reflux disease)   . Hypertension    Past Surgical History:  Procedure Laterality Date  . NO PAST SURGERIES        Blood pressure (!) 108/49, pulse 79, temperature 99 F (37.2 C), resp. rate 18, height 6\' 1"  (1.854 m), weight 117.1 kg (258 lb 4 oz), last menstrual period 12/01/2016, SpO2 100 %.  General Appearance: Alert, appropriate appearance for age. No acute distress HEENT Exam: Grossly normal Chest/Respiratory Exam: Normal chest wall and respirations. Clear to auscultation  Cardiovascular Exam: Regular rate and rhythm. S1, S2, no murmur Gastrointestinal Exam: soft, non-tender, Uterus gravid with size compatible with GA Psychiatric Exam: Alert and oriented, appropriate affect Vaginal exam: products found in the vagina and appears complete. No active bleeding from cervical os. Uterus is normal sze, mobile NT. 2 normal adnexa   ++++++++++++++++++++++++++++++++++++++++++++++++++++++++++++++++  Prenatal labs: ABO, Rh: --/--/B POS, B POS (09/11 0119) Antibody: NEG (09/11 0119) Rubella: 1.48 (09/11 0117) RPR: Non Reactive (09/11 0117)  HBsAg: Negative (03/26 0000)  HIV: Non Reactive (09/11 0117)     +++++++++++++++++++++++++++++++++++++++++++++++++++++++++++++++  Assessment and plan:  Complete Sab Will monitor bleeding for 1 hour Will discharge home afterwards and follow-up in office in 2 weeks   Deborah LaySandra Jackson Fetters MD

## 2017-02-20 NOTE — Discharge Instructions (Signed)

## 2017-02-20 NOTE — MAU Note (Signed)
RN observation for vaginal bleeding - no active bleeding noted, red smear on peripad.

## 2017-02-20 NOTE — MAU Note (Signed)
Taken to rm via wc.  NT in to assist getting changed

## 2017-02-20 NOTE — MAU Note (Signed)
Dx with miscarriage by Dr Steele BergPenn in office this morning.  Started bleeding this morning.  Feeling dizzy, cramping

## 2017-04-03 ENCOUNTER — Encounter (HOSPITAL_COMMUNITY): Payer: Self-pay | Admitting: Obstetrics and Gynecology

## 2017-04-06 ENCOUNTER — Encounter (HOSPITAL_COMMUNITY): Payer: Self-pay

## 2017-04-10 ENCOUNTER — Ambulatory Visit (HOSPITAL_COMMUNITY)
Admission: RE | Admit: 2017-04-10 | Discharge: 2017-04-10 | Disposition: A | Discharge status: Home / Self Care | Payer: Medicaid Other | Source: Ambulatory Visit | Attending: Obstetrics and Gynecology | Admitting: Obstetrics and Gynecology

## 2017-04-10 ENCOUNTER — Encounter (HOSPITAL_COMMUNITY): Payer: Self-pay

## 2017-07-06 LAB — OB RESULTS CONSOLE GC/CHLAMYDIA
Chlamydia: NEGATIVE
Gonorrhea: NEGATIVE

## 2017-08-03 LAB — OB RESULTS CONSOLE RPR: RPR: NONREACTIVE

## 2017-08-03 LAB — OB RESULTS CONSOLE HEPATITIS B SURFACE ANTIGEN: Hepatitis B Surface Ag: NEGATIVE

## 2017-08-03 LAB — OB RESULTS CONSOLE RUBELLA ANTIBODY, IGM: RUBELLA: IMMUNE

## 2017-08-03 LAB — OB RESULTS CONSOLE HIV ANTIBODY (ROUTINE TESTING): HIV: NONREACTIVE

## 2017-10-15 ENCOUNTER — Encounter (HOSPITAL_COMMUNITY): Payer: Self-pay | Admitting: *Deleted

## 2017-10-15 ENCOUNTER — Inpatient Hospital Stay (HOSPITAL_COMMUNITY)
Admission: AD | Admit: 2017-10-15 | Discharge: 2017-10-15 | Disposition: A | Discharge status: Home / Self Care | Payer: Managed Care, Other (non HMO) | Source: Ambulatory Visit | Attending: Obstetrics and Gynecology | Admitting: Obstetrics and Gynecology

## 2017-10-15 ENCOUNTER — Other Ambulatory Visit: Payer: Self-pay

## 2017-10-15 ENCOUNTER — Inpatient Hospital Stay (HOSPITAL_BASED_OUTPATIENT_CLINIC_OR_DEPARTMENT_OTHER): Payer: Managed Care, Other (non HMO)

## 2017-10-15 DIAGNOSIS — O4692 Antepartum hemorrhage, unspecified, second trimester: Secondary | ICD-10-CM

## 2017-10-15 DIAGNOSIS — O09292 Supervision of pregnancy with other poor reproductive or obstetric history, second trimester: Secondary | ICD-10-CM | POA: Diagnosis not present

## 2017-10-15 DIAGNOSIS — O4690 Antepartum hemorrhage, unspecified, unspecified trimester: Secondary | ICD-10-CM

## 2017-10-15 DIAGNOSIS — Z3A21 21 weeks gestation of pregnancy: Secondary | ICD-10-CM

## 2017-10-15 DIAGNOSIS — R109 Unspecified abdominal pain: Secondary | ICD-10-CM

## 2017-10-15 DIAGNOSIS — Z87891 Personal history of nicotine dependence: Secondary | ICD-10-CM | POA: Diagnosis not present

## 2017-10-15 DIAGNOSIS — B3731 Acute candidiasis of vulva and vagina: Secondary | ICD-10-CM

## 2017-10-15 DIAGNOSIS — O98812 Other maternal infectious and parasitic diseases complicating pregnancy, second trimester: Secondary | ICD-10-CM | POA: Insufficient documentation

## 2017-10-15 DIAGNOSIS — O26892 Other specified pregnancy related conditions, second trimester: Secondary | ICD-10-CM

## 2017-10-15 DIAGNOSIS — B373 Candidiasis of vulva and vagina: Secondary | ICD-10-CM | POA: Diagnosis not present

## 2017-10-15 LAB — WET PREP, GENITAL
Clue Cells Wet Prep HPF POC: NONE SEEN
Sperm: NONE SEEN
Trich, Wet Prep: NONE SEEN
Yeast Wet Prep HPF POC: NONE SEEN

## 2017-10-15 LAB — URINALYSIS, ROUTINE W REFLEX MICROSCOPIC
Bilirubin Urine: NEGATIVE
Glucose, UA: NEGATIVE mg/dL
Hgb urine dipstick: NEGATIVE
Ketones, ur: NEGATIVE mg/dL
Leukocytes, UA: NEGATIVE
Nitrite: NEGATIVE
Protein, ur: NEGATIVE mg/dL
Specific Gravity, Urine: 1.005 (ref 1.005–1.030)
pH: 7 (ref 5.0–8.0)

## 2017-10-15 MED ORDER — TERCONAZOLE 0.4 % VA CREA: 1.0000 | TOPICAL_CREAM | Freq: Every day | VAGINAL | 0 refills | Status: DC

## 2017-10-15 NOTE — MAU Note (Signed)
Urine sent to lab 

## 2017-10-15 NOTE — MAU Provider Note (Signed)
History     CSN: [id number]  Arrival date and time: 10/15/17 1247   First Provider Initiated Contact with Patient 10/15/17 1320      Chief Complaint  Patient presents with  . Vaginal Bleeding   Deborah James is a 32 y.o. G3P0 at [redacted]w[redacted]d who presents to MAU with complaints of vaginal bleeding. She reports vaginal bleeding started occurring today, describes bleeding as bright red vaginal bleeding without clots when she wipes. Reports bleeding is more than spotting but denies it being heavy like a period. Denies having to wear a pad or panty liner for vaginal bleeding. She denies recent IC. She reports vaginal bleeding is associated with abdominal pain. Describes abdominal pain as lower abdominal cramping that is intermittent, rates pain 3/10- has not taken any medication for abdominal pain. She reports abdominal pain has been occurring intermittently over the weekend. Hx of IUFD at 31 weeks in 2018 and miscarriage at 11 weeks early this year. She receives prenatal care at [facility] and has next appointment scheduled for upcoming Friday.    OB History    Gravida  3   Para  1   Term      Preterm  1   AB      Living  0     SAB      TAB      Ectopic      Multiple  0   Live Births              Past Medical History:  Diagnosis Date  . Asthma   . Chicken pox    As a child  . Fetal demise in singleton pregnancy greater than [redacted] weeks gestation, antepartum 09/26/2016  . Genital HSV   . GERD (gastroesophageal reflux disease)   . Hypertension     Past Surgical History:  Procedure Laterality Date  . NO PAST SURGERIES      History reviewed. No pertinent family history.  Social History   Tobacco Use  . Smoking status: Former Smoker    Last attempt to quit: 02/27/2016    Years since quitting: 1.6  . Smokeless tobacco: Never Used  Substance Use Topics  . Alcohol use: No  . Drug use: No    Allergies: No Known Allergies  Medications Prior to Admission  Medication Sig  Dispense Refill Last Dose  . prenatal vitamin w/FE, FA (PRENATAL 1 + 1) 27-1 MG TABS tablet Take 1 tablet by mouth daily at 12 noon.   10/14/2017 at Unknown time  . valACYclovir (VALTREX) 500 MG tablet Take 500 mg by mouth 2 (two) times daily. Pt takes when she has an outbreak.       Review of Systems  Constitutional: Negative.   Respiratory: Negative.   Cardiovascular: Negative.   Gastrointestinal: Positive for abdominal pain. Negative for constipation, diarrhea, nausea and vomiting.  Genitourinary: Positive for vaginal bleeding. Negative for difficulty urinating, dysuria, frequency, pelvic pain, vaginal discharge and vaginal pain.  Neurological: Negative.    Physical Exam   Blood pressure (!) 147/94, pulse 85, temperature 98.1 F (36.7 C), resp. rate 18, height 6\' 1"  (1.854 m), weight 127 kg, last menstrual period 05/21/2017, unknown if currently breastfeeding.  Physical Exam  Nursing note and vitals reviewed. Constitutional: She is oriented to person, place, and time. She appears well-developed and well-nourished. She appears distressed.  Cardiovascular: Normal rate, regular rhythm and normal heart sounds.  Respiratory: Effort normal. No respiratory distress. She has no wheezes.  GI: Soft. There is  no tenderness. There is no rebound.  Genitourinary: No bleeding in the vagina. Vaginal discharge found.  Genitourinary Comments: Pelvic: cervix pink, visually open, white curdy discharge present surrounding cervical os, no vaginal bleeding noted   Neurological: She is alert and oriented to person, place, and time. She displays normal reflexes. She exhibits normal muscle tone.  Psychiatric: She has a normal mood and affect. Her behavior is normal. Thought content normal.   Dilation: Closed(1cm externally) Effacement (%): Thick Cervical Position: Posterior Exam by:: V Ofilia Rayon CNM  FHR 147 by doppler   MAU Course  Procedures  MDM Orders Placed This Encounter  Procedures  . Wet prep,  genital  . [place] MFM OB LIMITED  . Urinalysis, Routine w reflex microscopic   [place] Mfm Ob Limited  Result Date: 10/15/2017 ----------------------------------------------------------------------  OBSTETRICS REPORT                       (Signed Final 10/15/2017 04:49 pm) ---------------------------------------------------------------------- Patient Info  ID #:       [id number]                          D.O.B.:  [birth year] (32 yrs)  Name:       Deborah James                    Visit Date: 10/15/2017 02:38 pm ---------------------------------------------------------------------- Performed By  Performed By:     Magnus Ivan           Secondary Phy.:   NAIMA DILLARD                    RDMS, RVT                                                             MD  Attending:        Adele Dan        Address:          [facility]                    MD                                                             Obstetrics &                                                             Gynecology                                                             [address]                                                             [address]                                                             [place], [place]                                                             [address]  Referred By:      MAU Nursing-           Location:         [facility]                    MAU/Triage ---------------------------------------------------------------------- Orders   #  Description                          Code         Ordered By   1  [place] MFM OB LIMITED                    19147.82     Steward Drone  ----------------------------------------------------------------------   #  Order #                    Accession #                 Episode #   1  [id number]                  [id number]                  [id number]   ---------------------------------------------------------------------- Indications   [redacted] weeks gestation of pregnancy                Z3A.21   Vaginal bleeding in pregnancy, second          O46.92   trimester   Poor obstetric history: Previous IUFD          O23.299   (stillbirth at 61wks; demise at 83wks)  ---------------------------------------------------------------------- Fetal Evaluation  Num Of Fetuses:         1  Fetal Heart Rate(bpm):  146  Cardiac Activity:       Observed  Presentation:           Cephalic  Placenta:               Posterior  Amniotic Fluid  AFI FV:      Subjectively normal ---------------------------------------------------------------------- OB History  Gravidity:    3         Prem:   1 ---------------------------------------------------------------------- Gestational Age  LMP:           21w 0d        Date:  05/21/17  EDD:   02/25/18  Best:          [place] 0d     Det. By:  LMP  (05/21/17)          EDD:   02/25/18 ---------------------------------------------------------------------- Cervix Uterus Adnexa  Cervix  Length:           4.23  cm.  Normal appearance by transabdominal scan. ---------------------------------------------------------------------- Comments  U/S images reviewed.  No evidence of placenta previa or  abruption.  Please remember that the absence of an  abruption on U/S does not rule it out.    No fetal abnormalities  are seen.   Questions answered.  10 minutes spent face to face with patient.  Recommendations: 1) Serial U/S every 4 weeks for fetal  growth 2) Weekly BPP beginning @ 36 weeks ---------------------------------------------------------------------- Recommendations  1) Serial U/S every 4 weeks for fetal growth 2) Weekly BPP  beginning @ 36 weeks ----------------------------------------------------------------------               Patsi Sears, MD Electronically Signed Final Report   10/15/2017 04:49 pm  ----------------------------------------------------------------------  Results for orders placed or performed during the hospital encounter of 10/15/17 (from the past 24 hour(s))  Wet prep, genital     Status: Abnormal   Collection Time: 10/15/17  1:32 PM  Result Value Ref Range   Yeast Wet Prep HPF POC NONE SEEN NONE SEEN   Trich, Wet Prep NONE SEEN NONE SEEN   Clue Cells Wet Prep HPF POC NONE SEEN NONE SEEN   WBC, Wet Prep HPF POC MANY (A) NONE SEEN   Sperm NONE SEEN    Wet prep negative, will treat for vaginal yeast infection based on physical examination  [place] results reviewed- Placenta posterior, no evidence of previa or abruption, CL 4.23cm  Discussed results of [place] and labs with patient. Bleeding precautions reviewed and reasons to return to MAU. Pelvic rest. Follow up as scheduled for prenatal appointments. Pt stable at time of discharge.   Assessment and Plan   1. Vaginal bleeding during pregnancy, antepartum   2. [redacted] weeks gestation of pregnancy   3. Abdominal pain during pregnancy in second trimester   4. Vulvovaginal candidiasis    Discharge home Follow up as scheduled for prenatal appointments  Return to MAU as needed  Pelvic rest  Hydration  Rx for Terazol sent to pharmacy of choice   Follow-up Information    [facility] Follow up.   Specialty:  Obstetrics and Gynecology Why:  Follow up as scheduled for prenatal appointments and return to MAU as needed for evaluation  Contact information: [address] [place] [address] [phone]         Allergies as of 10/15/2017   No Known Allergies     Medication List    TAKE these medications   prenatal vitamin w/FE, FA 27-1 MG Tabs tablet Take 1 tablet by mouth daily at 12 noon.   terconazole 0.4 % vaginal cream Commonly known as:  TERAZOL 7 Place 1 applicator vaginally at bedtime. Use for seven days   valACYclovir 500 MG tablet Commonly known  as:  VALTREX Take 500 mg by mouth 2 (two) times daily. Pt takes when she has an outbreak.       Sharyon Cable CNM 10/16/2017, 1:14 PM

## 2017-10-15 NOTE — MAU Note (Signed)
Pt presents to MAU with complaints of bright red vaginal bleeding with wiping this morning. Denies any pain.

## 2017-10-16 LAB — GC/CHLAMYDIA PROBE AMP (~~LOC~~) NOT AT ARMC
Chlamydia: NEGATIVE
Neisseria Gonorrhea: NEGATIVE

## 2017-12-19 ENCOUNTER — Encounter: Payer: Managed Care, Other (non HMO) | Attending: Obstetrics and Gynecology | Admitting: Registered"

## 2017-12-19 DIAGNOSIS — R7309 Other abnormal glucose: Secondary | ICD-10-CM | POA: Insufficient documentation

## 2017-12-19 DIAGNOSIS — O99810 Abnormal glucose complicating pregnancy: Secondary | ICD-10-CM

## 2017-12-20 ENCOUNTER — Encounter: Payer: Self-pay | Admitting: Registered"

## 2017-12-20 DIAGNOSIS — O99810 Abnormal glucose complicating pregnancy: Secondary | ICD-10-CM | POA: Insufficient documentation

## 2017-12-20 NOTE — Progress Notes (Signed)
Patient was seen on 12/19/17 for Gestational Diabetes self-management class at the [facility]. The following learning objectives were met by the patient during this course:   States the definition of Gestational Diabetes  States why dietary management is important in controlling blood glucose  Describes the effects each nutrient has on blood glucose levels  Demonstrates ability to create a balanced meal plan  Demonstrates carbohydrate counting   States when to check blood glucose levels  Demonstrates proper blood glucose monitoring techniques  States the effect of stress and exercise on blood glucose levels  States the importance of limiting caffeine and abstaining from alcohol and smoking  Blood glucose monitor given: Accu-chek Guide Me Lot # [id number] Exp: 12/19/18 Blood glucose reading: 113 mg/dL  Patient instructed to monitor glucose levels: FBS: 60 - <95; 1 hour: <140; 2 hour: <120  Patient received handouts:  Nutrition Diabetes and Pregnancy, including carb counting list  Patient will be seen for follow-up as needed.

## 2018-01-16 NOTE — L&D Delivery Note (Signed)
Delivery Note   02/18/2018  Date of delivery: 02/18/18  Deborah James, 33 y.o., @ [redacted]w[redacted]d,  G3P0110, who was admitted for IOL for GDMA2 controlled with insulin. I was called to the room when she progressed +2 station in the second stage of labor.  She pushed for 10/min.  She delivered a viable infant, cephalic and restituted to the ROT position over an intact perineum.  A nuchal cord   was not identified. The baby was placed on maternal abdomen while initial step of NRP were perfmored (Dry, Stimulated, and warmed). Hat placed on baby for thermoregulation. Delayed cord clamping was performed for 2 minutes.  Cord double clamped and cut.  Cord cut by father. Apgar scores were 9 and 9. Prophylactic Pitocin was started in the third stage of labor for active management. The placenta delivered spontaneously, shultz, with a 3 vessel cord and was sent to LD.  Inspection revealed 2nd degree and periclitoril. An examination of the vaginal vault and cervix was free from lacerations. The uterus was firm, bleeding stable.  The repair was done under epidural.   Placenta and umbilical artery blood gas were not sent.  There were no complications during the procedure.  Mom and baby skin to skin following delivery. Left in stable condition.  Maternal Info: Anesthesia:Epidural Episiotomy: No Lacerations:  2nd + small periclitoral Suture Repair: 2.0 vicryl interlocking stiches running, hemostatic post repair, clitoral used x1 figured eight stitch 3.0 vicryl SH  Est. Blood Loss (mL):  250  Newborn Info:  Baby Sex: female Circumcision: N/A Babies Name: [name] APGAR (1 MIN): 9   APGAR (5 MINS): 9   APGAR (10 MINS):     Mom to postpartum.  Baby to Couplet care / Skin to Skin.  Dr Normand Sloop aware of birth   [place], [place], NP-C 02/18/18 5:36 PM

## 2018-01-23 LAB — OB RESULTS CONSOLE GBS: GBS: NEGATIVE

## 2018-01-25 ENCOUNTER — Encounter: Payer: Managed Care, Other (non HMO) | Attending: Obstetrics and Gynecology | Admitting: *Deleted

## 2018-01-25 DIAGNOSIS — R7309 Other abnormal glucose: Secondary | ICD-10-CM | POA: Diagnosis present

## 2018-01-25 DIAGNOSIS — O99810 Abnormal glucose complicating pregnancy: Secondary | ICD-10-CM

## 2018-01-25 NOTE — Progress Notes (Signed)
Insulin Instruction  Patient was seen on 01/25/2018 for insulin instruction.  The following learning objectives were met by the patient during this visit:   Insulin Action of NPH and analog insulins  Reviewed syringe & vial including # units per syringe,    length of needles, vial   Hygiene and storage  Drawing up single and mixed doses if using vials   Single dose   Mixed dose:   Rotation of Sites  Hypoglycemia- symptoms, causes , treatment choices  Record keeping and MD follow up  Current Rx for insulin is NPH @ 4 units in AM and 4 units at night  Patient demonstrated understanding of insulin administration by return demonstration.  Patient received the following handouts:  Insulin Instruction Handout                                        Patient to start on insulin as Rx'd by MD  Patient will be seen for follow-up as needed.

## 2018-02-05 ENCOUNTER — Telehealth (HOSPITAL_COMMUNITY): Payer: Self-pay | Admitting: *Deleted

## 2018-02-05 NOTE — Telephone Encounter (Signed)
Preadmission screen  

## 2018-02-12 ENCOUNTER — Other Ambulatory Visit: Payer: Self-pay | Admitting: Obstetrics and Gynecology

## 2018-02-18 ENCOUNTER — Inpatient Hospital Stay (HOSPITAL_COMMUNITY): Payer: Managed Care, Other (non HMO) | Admitting: Anesthesiology

## 2018-02-18 ENCOUNTER — Encounter (HOSPITAL_COMMUNITY): Payer: Self-pay

## 2018-02-18 ENCOUNTER — Inpatient Hospital Stay (HOSPITAL_COMMUNITY)
Admission: RE | Admit: 2018-02-18 | Discharge: 2018-02-20 | DRG: 806 | Disposition: A | Discharge status: Home / Self Care | Payer: Managed Care, Other (non HMO) | Attending: Obstetrics and Gynecology | Admitting: Obstetrics and Gynecology

## 2018-02-18 DIAGNOSIS — A6000 Herpesviral infection of urogenital system, unspecified: Secondary | ICD-10-CM | POA: Diagnosis present

## 2018-02-18 DIAGNOSIS — Z87891 Personal history of nicotine dependence: Secondary | ICD-10-CM | POA: Diagnosis not present

## 2018-02-18 DIAGNOSIS — O24424 Gestational diabetes mellitus in childbirth, insulin controlled: Secondary | ICD-10-CM | POA: Diagnosis present

## 2018-02-18 DIAGNOSIS — Z3A39 39 weeks gestation of pregnancy: Secondary | ICD-10-CM | POA: Diagnosis not present

## 2018-02-18 DIAGNOSIS — J45909 Unspecified asthma, uncomplicated: Secondary | ICD-10-CM | POA: Diagnosis present

## 2018-02-18 DIAGNOSIS — Z7982 Long term (current) use of aspirin: Secondary | ICD-10-CM

## 2018-02-18 DIAGNOSIS — O9832 Other infections with a predominantly sexual mode of transmission complicating childbirth: Secondary | ICD-10-CM | POA: Diagnosis present

## 2018-02-18 DIAGNOSIS — O24419 Gestational diabetes mellitus in pregnancy, unspecified control: Secondary | ICD-10-CM | POA: Diagnosis present

## 2018-02-18 DIAGNOSIS — O99214 Obesity complicating childbirth: Secondary | ICD-10-CM | POA: Diagnosis present

## 2018-02-18 DIAGNOSIS — O9952 Diseases of the respiratory system complicating childbirth: Secondary | ICD-10-CM | POA: Diagnosis present

## 2018-02-18 LAB — COMPREHENSIVE METABOLIC PANEL
ALT: 27 U/L (ref 0–44)
AST: 23 U/L (ref 15–41)
Albumin: 2.7 g/dL — ABNORMAL LOW (ref 3.5–5.0)
Alkaline Phosphatase: 139 U/L — ABNORMAL HIGH (ref 38–126)
Anion gap: 8 (ref 5–15)
BUN: 13 mg/dL (ref 6–20)
CO2: 18 mmol/L — AB (ref 22–32)
Calcium: 9.1 mg/dL (ref 8.9–10.3)
Chloride: 107 mmol/L (ref 98–111)
Creatinine, Ser: 0.59 mg/dL (ref 0.44–1.00)
GFR calc Af Amer: >60 mL/min (ref >60)
GFR calc non Af Amer: >60 mL/min (ref >60)
Glucose, Bld: 102 mg/dL — ABNORMAL HIGH (ref 70–99)
POTASSIUM: 3.9 mmol/L (ref 3.5–5.1)
Sodium: 133 mmol/L — ABNORMAL LOW (ref 135–145)
Total Bilirubin: 0.6 mg/dL (ref 0.3–1.2)
Total Protein: 6.5 g/dL (ref 6.5–8.1)

## 2018-02-18 LAB — PROTEIN / CREATININE RATIO, URINE
Creatinine, Urine: 100 mg/dL
Protein Creatinine Ratio: 0.08 mg/mg{Cre} (ref 0.00–0.15)
TOTAL PROTEIN, URINE: 8 mg/dL

## 2018-02-18 LAB — GLUCOSE, CAPILLARY
Glucose-Capillary: 89 mg/dL (ref 70–99)
Glucose-Capillary: 95 mg/dL (ref 70–99)
Glucose-Capillary: 79 mg/dL (ref 70–99)
Glucose-Capillary: 66 mg/dL — ABNORMAL LOW (ref 70–99)
Glucose-Capillary: 85 mg/dL (ref 70–99)
Glucose-Capillary: 169 mg/dL — ABNORMAL HIGH (ref 70–99)

## 2018-02-18 LAB — CBC
HCT: 35.9 % — ABNORMAL LOW (ref 36.0–46.0)
Hemoglobin: 11.6 g/dL — ABNORMAL LOW (ref 12.0–15.0)
MCH: 30.5 pg (ref 26.0–34.0)
MCHC: 32.3 g/dL (ref 30.0–36.0)
MCV: 94.5 fL (ref 80.0–100.0)
Platelets: 339 10*3/uL (ref 150–400)
RBC: 3.8 MIL/uL — ABNORMAL LOW (ref 3.87–5.11)
RDW: 13.9 % (ref 11.5–15.5)
WBC: 6.6 10*3/uL (ref 4.0–10.5)
nRBC: 0 % (ref 0.0–0.2)

## 2018-02-18 LAB — TYPE AND SCREEN
ABO/RH(D): B POS
Antibody Screen: NEGATIVE

## 2018-02-18 LAB — URIC ACID: Uric Acid, Serum: 5 mg/dL (ref 2.5–7.1)

## 2018-02-18 LAB — LACTATE DEHYDROGENASE: LDH: 120 U/L (ref 98–192)

## 2018-02-18 MED ORDER — OXYTOCIN 40 UNITS IN NORMAL SALINE INFUSION - SIMPLE MED: 2.5000 [IU]/h | INTRAVENOUS | Status: DC

## 2018-02-18 MED ORDER — PRENATAL MULTIVITAMIN CH
1.0000 | ORAL_TABLET | Freq: Every day | ORAL | Status: DC
  Administered 2018-02-19 – 2018-02-20 (×2): 1 via ORAL
  Filled 2018-02-18 (×2): qty 1

## 2018-02-18 MED ORDER — TERBUTALINE SULFATE 1 MG/ML IJ SOLN
0.2500 mg | Freq: Once | INTRAMUSCULAR | Status: DC | PRN
  Filled 2018-02-18: qty 1

## 2018-02-18 MED ORDER — WITCH HAZEL-GLYCERIN EX PADS: 1.0000 "application " | MEDICATED_PAD | CUTANEOUS | Status: DC | PRN

## 2018-02-18 MED ORDER — ONDANSETRON HCL 4 MG/2ML IJ SOLN: 4.0000 mg | INTRAMUSCULAR | Status: DC | PRN

## 2018-02-18 MED ORDER — COCONUT OIL OIL: 1.0000 "application " | TOPICAL_OIL | Status: DC | PRN

## 2018-02-18 MED ORDER — ACETAMINOPHEN 325 MG PO TABS
650.0000 mg | ORAL_TABLET | ORAL | Status: DC | PRN
  Administered 2018-02-19: 650 mg via ORAL
  Filled 2018-02-18: qty 2

## 2018-02-18 MED ORDER — PHENYLEPHRINE 40 MCG/ML (10ML) SYRINGE FOR IV PUSH (FOR BLOOD PRESSURE SUPPORT)
80.0000 ug | PREFILLED_SYRINGE | INTRAVENOUS | Status: DC | PRN
  Filled 2018-02-18 (×2): qty 10

## 2018-02-18 MED ORDER — EPHEDRINE 5 MG/ML INJ
10.0000 mg | INTRAVENOUS | Status: DC | PRN
  Filled 2018-02-18: qty 2

## 2018-02-18 MED ORDER — SIMETHICONE 80 MG PO CHEW: 80.0000 mg | CHEWABLE_TABLET | ORAL | Status: DC | PRN

## 2018-02-18 MED ORDER — OXYTOCIN 40 UNITS IN NORMAL SALINE INFUSION - SIMPLE MED: 1.0000 m[IU]/min | INTRAVENOUS | Status: DC

## 2018-02-18 MED ORDER — DIBUCAINE 1 % RE OINT: 1.0000 "application " | TOPICAL_OINTMENT | RECTAL | Status: DC | PRN

## 2018-02-18 MED ORDER — LACTATED RINGERS IV SOLN: 500.0000 mL | INTRAVENOUS | Status: DC | PRN

## 2018-02-18 MED ORDER — FLEET ENEMA 7-19 GM/118ML RE ENEM: 1.0000 | ENEMA | RECTAL | Status: DC | PRN

## 2018-02-18 MED ORDER — ONDANSETRON HCL 4 MG/2ML IJ SOLN: 4.0000 mg | Freq: Four times a day (QID) | INTRAMUSCULAR | Status: DC | PRN

## 2018-02-18 MED ORDER — ACETAMINOPHEN 325 MG PO TABS: 650.0000 mg | ORAL_TABLET | ORAL | Status: DC | PRN

## 2018-02-18 MED ORDER — PHENYLEPHRINE 40 MCG/ML (10ML) SYRINGE FOR IV PUSH (FOR BLOOD PRESSURE SUPPORT)
80.0000 ug | PREFILLED_SYRINGE | INTRAVENOUS | Status: DC | PRN
  Filled 2018-02-18: qty 10

## 2018-02-18 MED ORDER — DIPHENHYDRAMINE HCL 25 MG PO CAPS: 25.0000 mg | ORAL_CAPSULE | Freq: Four times a day (QID) | ORAL | Status: DC | PRN

## 2018-02-18 MED ORDER — OXYTOCIN BOLUS FROM INFUSION
500.0000 mL | Freq: Once | INTRAVENOUS | Status: AC
  Administered 2018-02-18: 500 mL via INTRAVENOUS

## 2018-02-18 MED ORDER — ZOLPIDEM TARTRATE 5 MG PO TABS: 5.0000 mg | ORAL_TABLET | Freq: Every evening | ORAL | Status: DC | PRN

## 2018-02-18 MED ORDER — LACTATED RINGERS IV SOLN: 500.0000 mL | Freq: Once | INTRAVENOUS | Status: DC

## 2018-02-18 MED ORDER — LIDOCAINE-EPINEPHRINE (PF) 2 %-1:200000 IJ SOLN
INTRAMUSCULAR | Status: DC | PRN
  Administered 2018-02-18: 4 mL via EPIDURAL
  Administered 2018-02-18: 3 mL via EPIDURAL

## 2018-02-18 MED ORDER — TETANUS-DIPHTH-ACELL PERTUSSIS 5-2.5-18.5 LF-MCG/0.5 IM SUSP: 0.5000 mL | Freq: Once | INTRAMUSCULAR | Status: DC

## 2018-02-18 MED ORDER — SOD CITRATE-CITRIC ACID 500-334 MG/5ML PO SOLN: 30.0000 mL | ORAL | Status: DC | PRN

## 2018-02-18 MED ORDER — FENTANYL 2.5 MCG/ML BUPIVACAINE 1/10 % EPIDURAL INFUSION (WH - ANES)
14.0000 mL/h | INTRAMUSCULAR | Status: DC | PRN
  Administered 2018-02-18: 14 mL/h via EPIDURAL
  Filled 2018-02-18: qty 100

## 2018-02-18 MED ORDER — ONDANSETRON HCL 4 MG PO TABS: 4.0000 mg | ORAL_TABLET | ORAL | Status: DC | PRN

## 2018-02-18 MED ORDER — DIPHENHYDRAMINE HCL 50 MG/ML IJ SOLN: 12.5000 mg | INTRAMUSCULAR | Status: DC | PRN

## 2018-02-18 MED ORDER — LIDOCAINE HCL (PF) 1 % IJ SOLN
30.0000 mL | INTRAMUSCULAR | Status: DC | PRN
  Filled 2018-02-18: qty 30

## 2018-02-18 MED ORDER — IBUPROFEN 600 MG PO TABS
600.0000 mg | ORAL_TABLET | Freq: Four times a day (QID) | ORAL | Status: DC
  Administered 2018-02-18 – 2018-02-20 (×7): 600 mg via ORAL
  Filled 2018-02-18 (×7): qty 1

## 2018-02-18 MED ORDER — MISOPROSTOL 25 MCG QUARTER TABLET
25.0000 ug | ORAL_TABLET | ORAL | Status: DC | PRN
  Filled 2018-02-18: qty 1

## 2018-02-18 MED ORDER — SENNOSIDES-DOCUSATE SODIUM 8.6-50 MG PO TABS
2.0000 | ORAL_TABLET | ORAL | Status: DC
  Administered 2018-02-18 – 2018-02-19 (×2): 2 via ORAL
  Filled 2018-02-18 (×2): qty 2

## 2018-02-18 MED ORDER — BENZOCAINE-MENTHOL 20-0.5 % EX AERO
1.0000 "application " | INHALATION_SPRAY | CUTANEOUS | Status: DC | PRN
  Administered 2018-02-18: 1 via TOPICAL
  Filled 2018-02-18: qty 56

## 2018-02-18 MED ORDER — OXYTOCIN 40 UNITS IN NORMAL SALINE INFUSION - SIMPLE MED
1.0000 m[IU]/min | INTRAVENOUS | Status: DC
  Administered 2018-02-18: 1 m[IU]/min via INTRAVENOUS
  Filled 2018-02-18: qty 1000

## 2018-02-18 MED ORDER — FENTANYL CITRATE (PF) 100 MCG/2ML IJ SOLN: 50.0000 ug | INTRAMUSCULAR | Status: DC | PRN

## 2018-02-18 MED ORDER — LACTATED RINGERS IV SOLN
INTRAVENOUS | Status: DC
  Administered 2018-02-18 (×2): via INTRAVENOUS

## 2018-02-18 NOTE — Anesthesia Preprocedure Evaluation (Signed)
Anesthesia Evaluation  Patient identified by MRN, date of birth, ID band Patient awake    Reviewed: Allergy & Precautions, NPO status , Patient's Chart, lab work & pertinent test results  Airway Mallampati: II  TM Distance: >3 FB Neck ROM: Full    Dental no notable dental hx.    Pulmonary asthma , former smoker,    Pulmonary exam normal breath sounds clear to auscultation       Cardiovascular hypertension, negative cardio ROS Normal cardiovascular exam Rhythm:Regular Rate:Normal     Neuro/Psych negative neurological ROS  negative psych ROS   GI/Hepatic Neg liver ROS, GERD  ,  Endo/Other  diabetes, GestationalMorbid obesity  Renal/GU negative Renal ROS  negative genitourinary   Musculoskeletal negative musculoskeletal ROS (+)   Abdominal   Peds  Hematology negative hematology ROS (+)   Anesthesia Other Findings   Reproductive/Obstetrics (+) Pregnancy                             Anesthesia Physical Anesthesia Plan  ASA: III  Anesthesia Plan: Epidural   Post-op Pain Management:    Induction:   PONV Risk Score and Plan: Treatment may vary due to age or medical condition  Airway Management Planned: Natural Airway  Additional Equipment:   Intra-op Plan:   Post-operative Plan:   Informed Consent: I have reviewed the patients History and Physical, chart, labs and discussed the procedure including the risks, benefits and alternatives for the proposed anesthesia with the patient or authorized representative who has indicated his/her understanding and acceptance.       Plan Discussed with: Anesthesiologist  Anesthesia Plan Comments: (Patient identified. Risks, benefits, options discussed with patient including but not limited to bleeding, infection, nerve damage, paralysis, failed block, incomplete pain control, headache, blood pressure changes, nausea, vomiting, reactions to  medication, itching, and post partum back pain. Confirmed with bedside nurse the patient's most recent platelet count. Confirmed with the patient that they are not taking any anticoagulation, have any bleeding history or any family history of bleeding disorders. Patient expressed understanding and wishes to proceed. All questions were answered. )        Anesthesia Quick Evaluation

## 2018-02-18 NOTE — H&P (Signed)
Deborah James is a 33 y.o. female, G3P0110, IUP at 21 weeks, presenting for IOL for GDMA2 was uncontrolled with glyburide, switched to insulin and now at NPH 10 in morning and 18 at night. Pt has H/O HELLP with IUFD at 31 weeks last pregnancy. Pt endorse + Fm. Denies vaginal leakage. Denies vaginal bleeding. Denies feeling cxt's. HSV + no active lesion or prodrome s/sx on valtrex. GBS -. EFW 6.12lbs on 01/21 [place]. 11/18 negative pre e labs, pt currently denies HA< vision changes or RUQ pain.   Pregnancy Problems gestational diabetes mellitus (Started Glyburide at 32 weeks, changed to insulin at 35 weeks.) history of stillbirth (2018, at 31 weeks, with HELLP sx dx upon IUFD dx, ? abruption per  placental exam--ANTENATAL TESTING FROM 32 WEEKS. ASA 81 mg from viability. Negative APA w/u 2018. 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 Normal PIH labs at NOB ASA 81 mg daily)  Medications Accu-Chek Fastclix Lancet Drum  Accu-Chek Guide  acyclovir  ALCOHOL STERILE PREP PADS 100'S  aspirin  glyburide  Humulin N NPH U-100 Insulin  insulin syringe-needle U-100  Prenatal Vitamins  Sure Comfort Alcohol Prep Pads  valacyclovir    Patient Active Problem List   Diagnosis Date Noted  . GDM (gestational diabetes mellitus) 02/18/2018  . Abnormal glucose tolerance test (GTT) during pregnancy, antepartum 12/20/2017  . Obesity (BMI 30-39.9) 09/29/2016   Medications Prior to Admission  Medication Sig Dispense Refill Last Dose  . prenatal vitamin w/FE, FA  (PRENATAL 1 + 1) 27-1 MG TABS tablet Take 1 tablet by mouth daily at 12 noon.   10/14/2017 at Unknown time  . terconazole (TERAZOL 7) 0.4 % vaginal cream Place 1 applicator vaginally at bedtime. Use for seven days 45 g 0   . valACYclovir (VALTREX) 500 MG tablet Take 500 mg by mouth 2 (two) times daily. Pt takes when she has an outbreak.       Past Medical History:  Diagnosis Date  . Asthma   . Chicken pox    As a child  . Fetal demise in singleton pregnancy greater than [redacted] weeks gestation, antepartum 09/26/2016  . Genital HSV   . GERD (gastroesophageal reflux disease)   . Hypertension      No current facility-administered medications on file prior to encounter.    Current Outpatient Medications on File Prior to Encounter  Medication Sig Dispense Refill  . prenatal vitamin w/FE, FA (PRENATAL 1 + 1) 27-1 MG TABS tablet Take 1 tablet by mouth daily at 12 noon.    Marland Kitchen terconazole (TERAZOL 7) 0.4 % vaginal cream Place 1 applicator vaginally at bedtime. Use for seven days 45 g 0  . valACYclovir (VALTREX) 500 MG tablet Take 500 mg by mouth 2 (two) times daily. Pt takes when she has an outbreak.       No Known Allergies  History of present pregnancy: Pt Info/Preference:  Screening/Consents:  Labs:   EDD: Estimated Date of Delivery: 02/25/18  Establised: Patient's last menstrual period was 05/21/2017.  Anatomy Scan: Date: 10/19/2017 Placenta Location: posterior Genetic Screen: Panoroma:low risk female AFP:  First Tri: Quad:  Office: [facility]            First PNV: 13 wg Blood Type    Language: english Last PNV: 38.3 wg Rhogam    Flu Vaccine:  Declined   Antibody    TDaP vaccine UTD   GTT: Early: N/A Third Trimester: failed  Feeding Plan: Breast BTL: No Rubella: Immune (07/19 0000)  Contraception: ??? VBAC: No RPR: Nonreactive (07/19 0000)   Circumcision: No female   HBsAg: Negative (07/19 0000)  Pediatrician:  Dr Erik Obeyreitnauer   HIV: Non-reactive (07/19 0000)   Prenatal Classes: No Additional  US: Growth 01/21 efw 6.6oz GBS: Negative (01/08 0000)(For PCN allergy, check sensitivities)       Chlamydia: neg    MFM Referral/Consult:  GC: neg  Support Person: husband   PAP: 2019 noraml  Pain Management: epidural Neonatologist Referral:  Hgb Electrophoresis:  AA  Birth Plan: None   Hgb NOB: 11.7    28W: 11.2   OB History    Gravida  3   Para  1   Term      Preterm  1   AB  1   Living  0     SAB  1   TAB      Ectopic      Multiple  0   Live Births             Past Medical History:  Diagnosis Date  . Asthma   . Chicken pox    As a child  . Fetal demise in singleton pregnancy greater than [redacted] weeks gestation, antepartum 09/26/2016  . Genital HSV   . GERD (gastroesophageal reflux disease)   . Hypertension    Past Surgical History:  Procedure Laterality Date  . NO PAST SURGERIES     Family History: family history is not on file. Social History:  reports that she quit smoking about 1 years ago. She has never used smokeless tobacco. She reports that she does not drink alcohol or use drugs.   Prenatal Transfer Tool  Maternal Diabetes: Yes:  Diabetes Type:  Insulin/Medication controlled Genetic Screening: Normal Maternal Ultrasounds/Referrals: Normal Fetal Ultrasounds or other Referrals:  None Maternal Substance Abuse:  No Significant Maternal Medications:  Meds include: Other: Insulin, valtrex.  Significant Maternal Lab Results: Lab values include: Group B Strep negative  ROS:  Review of Systems  Constitutional: Negative.   HENT: Negative.   Eyes: Negative.   Respiratory: Negative.   Cardiovascular: Negative.   Gastrointestinal: Negative.   Genitourinary: Negative.   Musculoskeletal: Negative.   Skin: Negative.   Neurological: Negative.   Endo/Heme/Allergies: Negative.   Psychiatric/Behavioral: Negative.      Physical Exam: BP 121/68   Pulse (!) 120   Temp 98.2 F (36.8 C) (Oral)   Resp 18   Ht 6\' 1"  (1.854 m)   Wt (!) 139.7 kg   LMP  05/21/2017   BMI 40.64 kg/m   Physical Exam  Constitutional: She is oriented to person, place, and time and well-developed, well-nourished, and in no distress.  HENT:  Head: Normocephalic and atraumatic.  Eyes: Pupils are equal, round, and reactive to light. Conjunctivae are normal.  Neck: Normal range of motion. Neck supple.  Cardiovascular: Normal rate and regular rhythm.  Pulmonary/Chest: Effort normal and breath sounds normal.  Abdominal: Soft. Bowel sounds are normal.  Genitourinary:    Vagina and cervix normal.     Genitourinary Comments: Uterus gravida equal to dates, stable, pelvis adequate for  vaginal delivery. Uterus soft non-tender. No active lesions noted on cervix nor vagina.    Musculoskeletal: Normal range of motion.  Neurological: She is alert and oriented to person, place, and time. She has normal reflexes.  No clonus   Skin: Skin is warm and dry.  Psychiatric: Affect normal.  Nursing note and vitals reviewed.    NST: FHR baseline 140s bpm, Variability: moderate, Accelerations:present, Decelerations:  Absent= Cat 1/Reactive UC:   irregular, every occasional Q 3-5 minutes, mild maternal perception SVE:   Dilation: 4 Effacement (%): 50 Station: -2 Exam by:: J.Cox, RN, vertex verified by fetal sutures.  Leopold's: Position vertex, EFW 7.5 via leopold's.   Labs: Results for orders placed or performed during the hospital encounter of 02/18/18 (from the past 24 hour(s))  CBC     Status: Abnormal   Collection Time: 02/18/18  8:38 AM  Result Value Ref Range   WBC 6.6 4.0 - 10.5 K/uL   RBC 3.80 (L) 3.87 - 5.11 MIL/uL   Hemoglobin 11.6 (L) 12.0 - 15.0 g/dL   HCT 16.135.9 (L) 09.636.0 - 04.546.0 %   MCV 94.5 80.0 - 100.0 fL   MCH 30.5 26.0 - 34.0 pg   MCHC 32.3 30.0 - 36.0 g/dL   RDW 40.913.9 81.111.5 - 91.415.5 %   Platelets 339 150 - 400 K/uL   nRBC 0.0 0.0 - 0.2 %    Imaging:  No results found.  MAU Course: Orders Placed This Encounter  Procedures  . OB RESULT CONSOLE Group B  Strep  . CBC  . RPR  . OB RESULTS CONSOLE GC/Chlamydia  . OB RESULTS CONSOLE RPR  . OB RESULTS CONSOLE HIV antibody  . OB RESULTS CONSOLE Rubella Antibody  . OB RESULTS CONSOLE Hepatitis B surface antigen  . Diet clear liquid Room service appropriate? Yes; Fluid consistency: Thin  . Discontinue Pitocin if tachysystole with non-reassuring FHR is present  . Evaluate fetal heart rate to establish reassuring pattern prior to initiating Cytotec or Pitocin  . If tachysystole WITH reassuring FHR present notify MD / CNM  . Initiate intrauterine resuscitation if tachysystole with non-reasuring FHR is present  . Labor Induction  . May administer Terbutaline 0.25 mg SQ x 1 dose if tachysystole with non-reassuring FHR is presesnt  . Nofify MD/CNM if tachysystole with non-reassuring FHR is present  . Perform a cervical exam prior to initiating Cytotec or Pitocin  . Activity as tolerated  . Fetal monitoring per unit policy  . Notify Physician  . Vitals signs per unit policy  . Order Rapid HIV per protocol if no results on chart  . Cervical Exam  . Discontinue foley prior to vaginal delivery  . Fundal check post delivery every 15 min x 1 hour then every 30 min x 1 hour  . If Rapid HIV test positive or known HIV positive: initiate AZT orders  . Initiate Carrier Fluid Protocol  . Initiate Oral Care Protocol  . Insert foley catheter  . May in and out cath x 2 for inability to void  . Measure blood pressure post delivery every 15 min x 1 hour then every 30 min x 1 hour  . Patient may have epidural placement upon request  . May use local infiltration of 1% lidocaine plain to produce a skin wheal prior to IV insertion  . Notify in-house Anesthesia team of nausea and vomiting greater than 5 hours  . Assess for signs/symptoms of PIH/preeclampsia  . RN to place lab order for: CBC  if one has not been drawn in the past 6 hours for all patients with hypertensive disease, pre-eclampsia, eclampsia,  thrombocytopenia or previous PLTC<150,000.  . Identify to Anesthesia if patient plans to have postpartum tubal ligation; do not remove epidural without discussion with Anesthesiologist  . Vital signs following Epidural Placement, re-bolus or re-dose monitor patient's BP and oxygen saturation every 5 minutes for 30 minutes  . RN to remain at bedside continuously for 30 minutes post epidural placement, post re-bolus / re-dose  . Notify Anesthesia if the patient becomes short of breath or complains of heaviness in chest, chest pain, and/or unrelieved pain  . Notify Anesthesia prior to discontinuing epidural infusion  . Full code  . Oxygen therapy  . Type and screen  . Insert and maintain IV Line  . Admit to Inpatient (patient's expected length of stay will be greater than 2 midnights or inpatient only procedure)   Meds ordered this encounter  Medications  . misoprostol (CYTOTEC) tablet 25 mcg  . oxytocin (PITOCIN) IV infusion 40 units in NS 1000 mL - Premix    Order Specific Question:   Begin infusion at:    Answer:   1 milli-unit/min (1.5 mL/hr)    Order Specific Question:   Increase infusion by:    Answer:   1 milli-unit/min (1.5 mL/hr)  . terbutaline (BRETHINE) injection 0.25 mg  . lactated ringers infusion  . lactated ringers infusion 500-1,000 mL  . oxytocin (PITOCIN) IV BOLUS FROM BAG  . oxytocin (PITOCIN) IV infusion 40 units in NS 1000 mL - Premix  . acetaminophen (TYLENOL) tablet 650 mg  . fentaNYL (SUBLIMAZE) injection 50-100 mcg  . lidocaine (PF) (XYLOCAINE) 1 % injection 30 mL  . ondansetron (ZOFRAN) injection 4 mg  . sodium citrate-citric acid (ORACIT) solution 30 mL  . sodium phosphate (FLEET) 7-19 GM/118ML enema 1 enema  . ePHEDrine injection 10 mg  . PHENYLephrine 40 mcg/ml in normal saline Adult IV Push Syringe  . lactated ringers infusion 500 mL  . fentaNYL 2.5 mcg/ml w/bupivacaine 0.1% in NS epidural infusion (WH-ANES)  . diphenhydrAMINE (BENADRYL) injection  12.5 mg  . ePHEDrine injection 10 mg  . PHENYLephrine 40 mcg/ml in normal saline Adult IV Push Syringe    Assessment/Plan: Deborah James is a 33 y.o. female, G3P0110, IUP at 93 weeks, presenting for IOL for GDMA2 was uncontrolled with glyburide, switched to insulin and now at NPH 10 in morning and 18 at night. Pt has H/O HELLP with IUFD at 31 weeks last pregnancy. Pt endorse + Fm. Denies vaginal leakage. Denies vaginal bleeding. Denies feeling cxt's. HSV + no active lesion or prodrome s/sx on valtrex. GBS -. EFW 6.12lbs on 01/21 [place]. 11/18 negative pre e labs, pt currently denies HA< vision changes or RUQ pain. BP currently 130/90s  Pregnancy Problems gestational diabetes mellitus (Started Glyburide at 32 weeks, changed to insulin at 35 weeks.) history of stillbirth (2018, at 31 weeks, with HELLP sx dx upon IUFD dx, ? abruption per  placental exam--ANTENATAL TESTING FROM 32 WEEKS. ASA 81 mg from viability. Negative APA w/u 2018. 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 Normal PIH labs at NOB ASA 81 mg daily)  FWB: Cat 1 Fetal Tracing.   Plan: Admit to Birthing Suite per consult with Dr Normand Sloop Routine [facility] orders H/O HELLP: Check baseline labs due to pt had no s/sx with HELLP with IUDF last pregnancy.  GDMA2: Q2 H BS checks.  Pain med/epidural prn Anticipate labor progression   Dale [place] NP-C, CNM, MSN 02/18/2018, 9:04 AM

## 2018-02-18 NOTE — Anesthesia Procedure Notes (Signed)
Epidural Patient location during procedure: OB Start time: 02/18/2018 11:05 AM End time: 02/18/2018 11:20 AM  Staffing Anesthesiologist: Elmer Picker, MD Performed: anesthesiologist   Preanesthetic Checklist Completed: patient identified, pre-op evaluation, timeout performed, IV checked, risks and benefits discussed and monitors and equipment checked  Epidural Patient position: sitting Prep: site prepped and draped and DuraPrep Patient monitoring: continuous pulse ox, blood pressure, heart rate and cardiac monitor Approach: midline Location: L3-L4 Injection technique: LOR air  Needle:  Needle type: Tuohy  Needle gauge: 17 G Needle length: 9 cm Needle insertion depth: 7 cm Catheter type: closed end flexible Catheter size: 19 Gauge Catheter at skin depth: 15 cm Test dose: negative  Assessment Sensory level: T8 Events: blood not aspirated, injection not painful, no injection resistance, negative IV test and no paresthesia  Additional Notes Patient identified. Risks/Benefits/Options discussed with patient including but not limited to bleeding, infection, nerve damage, paralysis, failed block, incomplete pain control, headache, blood pressure changes, nausea, vomiting, reactions to medication both or allergic, itching and postpartum back pain. Confirmed with bedside nurse the patient's most recent platelet count. Confirmed with patient that they are not currently taking any anticoagulation, have any bleeding history or any family history of bleeding disorders. Patient expressed understanding and wished to proceed. All questions were answered. Sterile technique was used throughout the entire procedure. Please see nursing notes for vital signs. Test dose was given through epidural catheter and negative prior to continuing to dose epidural or start infusion. Warning signs of high block given to the patient including shortness of breath, tingling/numbness in hands, complete motor block,  or any concerning symptoms with instructions to call for help. Patient was given instructions on fall risk and not to get out of bed. All questions and concerns addressed with instructions to call with any issues or inadequate analgesia.  Reason for block:procedure for pain

## 2018-02-18 NOTE — Progress Notes (Signed)
Labor Progress Note  Per HPI: Denys Parrado is a 33 y.o. female, G3P0110, IUP at 39 weeks, presenting for IOL for GDMA2 was uncontrolled with glyburide, switched to insulin and now at NPH 10 in morning and 18 at night. Pt has H/O HELLP with IUFD at 31 weeks last pregnancy. Pt endorse + Fm. Denies vaginal leakage. Denies vaginal bleeding. Denies feeling cxt's. HSV + no active lesion or prodrome s/sx on valtrex. GBS -. EFW 6.12lbs on 01/21 [place]. 11/18 negative pre e labs, pt currently denies HA< vision changes or RUQ pain.   Pregnancy Problems  gestational diabetes mellitus (Started Glyburide at 32 weeks, changed to insulin at 35 weeks.)  history of stillbirth (2018, at 31 weeks, with HELLP sx dx upon IUFD dx, ? abruption per   placental exam--ANTENATAL TESTING FROM 32 WEEKS. ASA 81 mg from viability. Negative APA w/u 2018. 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 Normal PIH labs at NOB ASA 81 mg daily)  Subjective: Pt in bed in stable condition with family and support at bedside, Reviewed risk and benifits bout AROM, pt just got epidural and comfortable, verbally consented to AROM without questions. Pt denies HA, vision changes, nor RUQ pain.  Patient Active Problem List   Diagnosis Date Noted  . GDM (gestational diabetes mellitus) 02/18/2018  . Abnormal glucose tolerance test (GTT) during pregnancy, antepartum 12/20/2017  . Obesity (BMI 30-39.9) 09/29/2016   Objective: BP 114/78   Pulse (!) 111   Temp 98.2 F (36.8 C) (Oral)   Resp  18   Ht 6\' 1"  (1.854 m)   Wt (!) 139.7 kg   LMP 05/21/2017   SpO2 99%   BMI 40.64 kg/m  No intake/output data recorded. Total I/O In: 433.1 [I.V.:433.1] Out: -  NST: FHR baseline 140 bpm, Variability: moderate, Accelerations:present, Decelerations:  Absent= Cat 1/Reactive CTX:  regular, every 3-4 minutes, lasting 50-60 seconds Uterus gravid, soft non tender, moderate to palpate with contractions.  SVE:  Dilation: 6 Effacement (%): 90 Station: -1 Exam by:: Woodward Klem, CNM Pitocin at 10 mUn/min  Assessment:  Ayoka Spake is a 33 y.o. female, G3P0110, IUP at 39 weeks, presenting for IOL for GDMA2 was uncontrolled with glyburide, switched to insulin and now at NPH 10 in morning and 18 at night. Pt has H/O HELLP with IUFD at 31 weeks last pregnancy. Pt endorse + Fm. Denies vaginal leakage. Denies vaginal bleeding. Denies feeling cxt's. HSV + no active lesion or prodrome s/sx on valtrex. GBS -. EFW 6.12lbs on 01/21 [place]. 11/18 negative pre e labs, pt currently denies HA< vision changes or RUQ pain. Progressing well.  Patient Active Problem List   Diagnosis Date Noted  . GDM (gestational diabetes mellitus) 02/18/2018  . Abnormal glucose tolerance test (GTT) during pregnancy, antepartum 12/20/2017  . Obesity (BMI 30-39.9) 09/29/2016   NICHD: Category 1  Membranes:  AROM, clear, mod on 02/03 @ 1400 x 0hrs, no s/s of infection  Induction:    Pitocin - 10  Pain management:  Epidural placement:  at 1515 on 02/03  GBS Negative  A1GDM: stable sugars  CBG (last 3)  Recent Labs    02/18/18 1028 02/18/18 1235 02/18/18 1423  GLUCAP 89 95 79   H/O HELLP: BP now 126/83, no s/sx, pt stable. PCR 0.08, plat 339, creat 0.59, AST 23, ALT 27, LDH 120, uric acid 5.0   Plan: Continue labor plan Continuous/intermittent monitoring GDMA2: Q2BS monitoring H/O Hellp: Monitor BP, if BP or s/sx present then repeat labs Rest/Frequent position changes to facilitate fetal rotation and  descent. Will reassess with cervical exam in 4 or earlier if necessary Continue pitocin per protocol Anticipate labor progression and vaginal delivery.   Md Dillard aware of plan and verbalized agreement.   [place], NP-C, CNM, MSN 02/18/2018. 2:52 PM

## 2018-02-18 NOTE — Anesthesia Pain Management Evaluation Note (Signed)
  CRNA Pain Management Visit Note  Patient: Deborah James, 33 y.o., female  "Hello I am a member of the anesthesia team at [facility]. We have an anesthesia team available at all times to provide care throughout the hospital, including epidural management and anesthesia for C-section. I don't know your plan for the delivery whether it a natural birth, water birth, IV sedation, nitrous supplementation, doula or epidural, but we want to meet your pain goals."   1.Was your pain managed to your expectations on prior hospitalizations?   Yes   2.What is your expectation for pain management during this hospitalization?     Epidural  3.How can we help you reach that goal? epidural  Record the patient's initial score and the patient's pain goal.   Pain: 3  Pain Goal: 5 [facility] wants you to be able to say your pain was always managed very well.  Adaliah Hiegel 02/18/2018

## 2018-02-18 NOTE — Lactation Note (Signed)
This note was copied from a baby's chart. Lactation Consultation Note  Patient Name: Deborah James [id number] Date: 02/18/2018 Reason for consult: Initial assessment  Initial visit at 4 hours of life. Mom is a P1, but did have a stillbirth at 64 weeks in 2018. Mom reports that her milk came to volume on the 5th day with the demise.  Mom did report + breast changes w/pregnancy. Hand expression was taught to Mom, but there was no colostrum yielded. Mom has been contemplating just pumping & BO, so we will set her up w/a DEBP in case she pursues this route (and to see if her milk will come to volume sooner).   "[name]" has had 2 attempts at the breast so far, but has not effectively latched & is currently sleeping.    Lab is currently in the room to obtain a 2nd glucose on [name]. Mom does not object to formula, if needed for glucose stability, etc.    [name] [facility] 02/18/2018, 10:24 PM

## 2018-02-19 ENCOUNTER — Other Ambulatory Visit: Payer: Self-pay

## 2018-02-19 LAB — RPR: RPR Ser Ql: NONREACTIVE

## 2018-02-19 LAB — CBC
HCT: 31.8 % — ABNORMAL LOW (ref 36.0–46.0)
Hemoglobin: 10.4 g/dL — ABNORMAL LOW (ref 12.0–15.0)
MCH: 30.8 pg (ref 26.0–34.0)
MCHC: 32.7 g/dL (ref 30.0–36.0)
MCV: 94.1 fL (ref 80.0–100.0)
Platelets: 292 10*3/uL (ref 150–400)
RBC: 3.38 MIL/uL — ABNORMAL LOW (ref 3.87–5.11)
RDW: 14 % (ref 11.5–15.5)
WBC: 11 10*3/uL — ABNORMAL HIGH (ref 4.0–10.5)
nRBC: 0 % (ref 0.0–0.2)

## 2018-02-19 LAB — GLUCOSE, CAPILLARY: Glucose-Capillary: 99 mg/dL (ref 70–99)

## 2018-02-19 NOTE — Progress Notes (Signed)
Post Partum Day 1  Subjective: no complaints, up ad lib, voiding, tolerating PO and + flatus.   Objective: Vitals:   02/18/18 2025 02/18/18 2353 02/19/18 0500 02/19/18 0900  BP: (!) 108/48 127/76 131/81 104/60  Pulse: 78 84 81 73  Resp: 16 18 16 16   Temp: 98.7 F (37.1 C) 98 F (36.7 C) 98.4 F (36.9 C) 98.2 F (36.8 C)  TempSrc: Oral   Oral  SpO2: 98% 97% 100%   Weight:      Height:        Physical Exam:  General: alert and cooperative Lochia: appropriate Uterine Fundus: firm Incision: n/a DVT Evaluation: No evidence of DVT seen on physical exam. Negative Homan's sign. No cords or calf tenderness. No significant calf/ankle edema.  Recent Labs    02/18/18 0838 02/19/18 0600  HGB 11.6* 10.4*  HCT 35.9* 31.8*    Assessment/Plan: Plan for discharge tomorrow and Breastfeeding  Fasting BG was 99, reviewed with Dr. Mora Appl who ordered discontinuation of CBGs, will follow-up postpartum with a 2hr GTT.   LOS: 1 day   Janeece Riggers 02/19/2018, 9:57 AM

## 2018-02-19 NOTE — Lactation Note (Signed)
This note was copied from a baby's chart. Lactation Consultation Note  Patient Name: Deborah James [id number] Date: 02/19/2018 Reason for consult: Follow-up assessment  Mom was set up with a DEBP. Mom was also shown how to assemble & use hand pump (single- & double-mode) that was included in pump kit. At this time, size 24 flanges seem appropriate. There was no yield. Dad was shown how to wash pump parts.   Infant still falling asleep during the process of latching.  Lonn [place], RN given update.  Matthias Hughs [facility] 02/19/2018, 12:42 AM

## 2018-02-19 NOTE — Anesthesia Postprocedure Evaluation (Signed)
Anesthesia Post Note  Patient: Deborah James  Procedure(s) Performed: AN AD HOC LABOR EPIDURAL     Patient location during evaluation: Mother Baby Anesthesia Type: Epidural Level of consciousness: awake and alert and oriented Pain management: satisfactory to patient Vital Signs Assessment: post-procedure vital signs reviewed and stable Respiratory status: respiratory function stable Cardiovascular status: stable Postop Assessment: no headache, no backache, epidural receding, patient able to bend at knees, no signs of nausea or vomiting and adequate PO intake Anesthetic complications: no    Last Vitals:  Vitals:   02/18/18 2353 02/19/18 0500  BP: 127/76 131/81  Pulse: 84 81  Resp: 18 16  Temp: 36.7 C 36.9 C  SpO2: 97% 100%    Last Pain:  Vitals:   02/19/18 0500  TempSrc:   PainSc: 6    Pain Goal:                   [name]

## 2018-02-19 NOTE — Progress Notes (Signed)
CSW received consult due to MOB's history of IUFD. CSW followed up with MOB's RN to inquire if MOB was interested in speaking with CSW about history of IUFD. MOB's RN reported that MOB is not interested in speaking with CSW about history of IUFD.   Jassmin Kemmerer, LCSWA Clinical Social Worker [facility] Cell#: [phone]  

## 2018-02-20 MED ORDER — IBUPROFEN 600 MG PO TABS: 600.0000 mg | ORAL_TABLET | Freq: Four times a day (QID) | ORAL | 0 refills | Status: DC | PRN

## 2018-02-20 NOTE — Discharge Summary (Signed)
OB Discharge Summary     Patient Name: Deborah James DOB: 1985/08/22 MRN: [id number]  Date of admission: 02/18/2018 Delivering MD: Dale Ruso   Date of discharge: 02/20/2018  Admitting diagnosis: 39 WKS INDUCTON Intrauterine pregnancy: [redacted]w[redacted]d     Secondary diagnosis:  Active Problems:   GDM (gestational diabetes mellitus)     Discharge diagnosis: Term Pregnancy Delivered and GDM A2                                                                                                Post partum procedures:n/a  Augmentation: AROM and Pitocin  Complications: None  Hospital course:  Induction of Labor With Vaginal Delivery   33 y.o. yo [id number] at [redacted]w[redacted]d was admitted to the hospital 02/18/2018 for induction of labor.  Indication for induction: A2 DM.  Patient had an uncomplicated labor course as follows: Membrane Rupture Time/Date: 2:03 PM ,02/18/2018   Intrapartum Procedures: Episiotomy: None [1]                                         Lacerations:  2nd degree [3];Labial [10]  Patient had delivery of a Viable infant.  Information for the patient's newborn:  Ronita, Behner [[id number]]  Delivery Method: Vaginal, Spontaneous(Filed from Delivery Summary)   02/18/2018  Details of delivery can be found in separate delivery note.  Patient had a routine postpartum course. Patient is discharged home 02/20/18.  Physical exam  Vitals:   02/19/18 0900 02/19/18 1330 02/19/18 2236 02/20/18 0546  BP: 104/60 115/69 128/86 133/88  Pulse: 73 66 89 75  Resp: 16 16 18 18   Temp: 98.2 F (36.8 C) 98.1 F (36.7 C) 98 F (36.7 C) 98.3 F (36.8 C)  TempSrc: Oral Oral Oral Oral  SpO2:    97%  Weight:      Height:       General: alert, cooperative and no distress Lochia: appropriate Uterine Fundus: firm Incision: N/A DVT Evaluation: No evidence of DVT seen on physical exam. Negative Homan's sign. No cords or calf tenderness. No significant calf/ankle edema. Preeclampsia: patient has a history of  HELLP syndrome. Her preeclampsia labs were negative on admission and she denies any symptoms of preeclampsia today. Preeclampsia symptoms reviewed with patient who verbalizes she will call or present to [facility] if she experiences symptoms of preeclampsia.   Labs: Lab Results  Component Value Date   WBC 11.0 (H) 02/19/2018   HGB 10.4 (L) 02/19/2018   HCT 31.8 (L) 02/19/2018   MCV 94.1 02/19/2018   PLT 292 02/19/2018   CMP Latest Ref Rng & Units 02/18/2018  Glucose 70 - 99 mg/dL 174(Y)  BUN 6 - 20 mg/dL 13  Creatinine 8.14 - 4.81 mg/dL 8.56  Sodium 314 - 970 mmol/L 133(L)  Potassium 3.5 - 5.1 mmol/L 3.9  Chloride 98 - 111 mmol/L 107  CO2 22 - 32 mmol/L 18(L)  Calcium 8.9 - 10.3 mg/dL 9.1  Total Protein 6.5 - 8.1 g/dL 6.5  Total Bilirubin 0.3 -  1.2 mg/dL 0.6  Alkaline Phos 38 - 126 U/L 139(H)  AST 15 - 41 U/L 23  ALT 0 - 44 U/L 27    Discharge instruction: per After Visit Summary and "Baby and Me Booklet". Preeclampsia precautions discussed and handout given.   After visit meds:  Allergies as of 02/20/2018   No Known Allergies     Medication List    STOP taking these medications   aspirin 81 MG chewable tablet   HUMULIN N 100 UNIT/ML injection Generic drug:  insulin NPH Human   terconazole 0.4 % vaginal cream Commonly known as:  TERAZOL 7     TAKE these medications   ibuprofen 600 MG tablet Commonly known as:  ADVIL,MOTRIN Take 1 tablet (600 mg total) by mouth every 6 (six) hours as needed for moderate pain.   prenatal vitamin w/FE, FA 27-1 MG Tabs tablet Take 1 tablet by mouth daily at 12 noon.   VALTREX 1000 MG tablet Generic drug:  valACYclovir Take 1,000 mg by mouth at bedtime.       Diet: routine diet  Activity: Advance as tolerated. Pelvic rest for 6 weeks.   Outpatient follow up:6 weeks Follow up Appt:No future appointments. Follow up Visit:No follow-ups on file.  Postpartum contraception: Undecided  Newborn Data: Live born female  Birth Weight: 8  lb (3630 g) APGAR: 9, 9  Newborn Delivery   Birth date/time:  02/18/2018 17:08:00 Delivery type:  Vaginal, Spontaneous     Baby Feeding: Bottle and Breast Disposition:home with mother   02/20/2018 Janeece RiggersEllis K Emanuel Campos, CNM

## 2018-02-20 NOTE — Discharge Instructions (Signed)
Postpartum Care After Vaginal Delivery ° °The period of time right after you deliver your newborn is called the postpartum period. °What kind of medical care will I receive? °· You may continue to receive fluids and medicines through an IV tube inserted into one of your veins. °· If an incision was made near your vagina (episiotomy) or if you had some vaginal tearing during delivery, cold compresses may be placed on your episiotomy or your tear. This helps to reduce pain and swelling. °· You may be given a squirt bottle to use when you go to the bathroom. You may use this until you are comfortable wiping as usual. To use the squirt bottle, follow these steps: °? Before you urinate, fill the squirt bottle with warm water. Do not use hot water. °? After you urinate, while you are sitting on the toilet, use the squirt bottle to rinse the area around your urethra and vaginal opening. This rinses away any urine and blood. °? You may do this instead of wiping. As you start healing, you may use the squirt bottle before wiping yourself. Make sure to wipe gently. °? Fill the squirt bottle with clean water every time you use the bathroom. °· You will be given sanitary pads to wear. °How can I expect to feel? °· You may not feel the need to urinate for several hours after delivery. °· You will have some soreness and pain in your abdomen and vagina. °· If you are breastfeeding, you may have uterine contractions every time you breastfeed for up to several weeks postpartum. Uterine contractions help your uterus return to its normal size. °· It is normal to have vaginal bleeding (lochia) after delivery. The amount and appearance of lochia is often similar to a menstrual period in the first week after delivery. It will gradually decrease over the next few weeks to a dry, yellow-brown discharge. For most women, lochia stops completely by 6-8 weeks after delivery. Vaginal bleeding can vary from woman to woman. °· Within the first few  days after delivery, you may have breast engorgement. This is when your breasts feel heavy, full, and uncomfortable. Your breasts may also throb and feel hard, tightly stretched, warm, and tender. After this occurs, you may have milk leaking from your breasts. Your health care provider can help you relieve discomfort due to breast engorgement. Breast engorgement should go away within a few days. °· You may feel more sad or worried than normal due to hormonal changes after delivery. These feelings should not last more than a few days. If these feelings do not go away after several days, speak with your health care provider. °How should I care for myself? °· Tell your health care provider if you have pain or discomfort. °· Drink enough water to keep your urine clear or pale yellow. °· Wash your hands thoroughly with soap and water for at least 20 seconds after changing your sanitary pads, after using the toilet, and before holding or feeding your baby. °· If you are not breastfeeding, avoid touching your breasts a lot. Doing this can make your breasts produce more milk. °· If you become weak or lightheaded, or you feel like you might faint, ask for help before: °? Getting out of bed. °? Showering. °· Change your sanitary pads frequently. Watch for any changes in your flow, such as a sudden increase in volume, a change in color, the passing of large blood clots. If you pass a blood clot from your vagina,   save it to show to your health care provider. Do not flush blood clots down the toilet without having your health care provider look at them. °· Make sure that all your vaccinations are up to date. This can help protect you and your baby from getting certain diseases. You may need to have immunizations done before you leave the hospital. °· If desired, talk with your health care provider about methods of family planning or birth control (contraception). °How can I start bonding with my baby? °Spending as much time as  possible with your baby is very important. During this time, you and your baby can get to know each other and develop a bond. Having your baby stay with you in your room (rooming in) can give you time to get to know your baby. Rooming in can also help you become comfortable caring for your baby. Breastfeeding can also help you bond with your baby. °How can I plan for returning home with my baby? °· Make sure that you have a car seat installed in your vehicle. °? Your car seat should be checked by a certified car seat installer to make sure that it is installed safely. °? Make sure that your baby fits into the car seat safely. °· Ask your health care provider any questions you have about caring for yourself or your baby. Make sure that you are able to contact your health care provider with any questions after leaving the hospital. °This information is not intended to replace advice given to you by your health care provider. Make sure you discuss any questions you have with your health care provider. °Document Released: 10/30/2006 Document Revised: 06/07/2015 Document Reviewed: 12/07/2014 °Elsevier Interactive Patient Education © 2018 [employer] ° ° °Postpartum Depression and Baby Blues °The postpartum period begins right after the birth of a baby. During this time, there is often a great amount of joy and excitement. It is also a time of many changes in the life of the parents. Regardless of how many times a mother gives birth, each child brings new challenges and dynamics to the family. It is not unusual to have feelings of excitement along with confusing shifts in moods, emotions, and thoughts. All mothers are at risk of developing postpartum depression or the "baby blues." These mood changes can occur right after giving birth, or they may occur many months after giving birth. The baby blues or postpartum depression can be mild or severe. Additionally, postpartum depression can go away rather quickly, or it can  be a long-term condition. °What are the causes? °Raised hormone levels and the rapid drop in those levels are thought to be a main cause of postpartum depression and the baby blues. A number of hormones change during and after pregnancy. Estrogen and progesterone usually decrease right after the delivery of your baby. The levels of thyroid hormone and various cortisol steroids also rapidly drop. Other factors that play a role in these mood changes include major life events and genetics. °What increases the risk? °If you have any of the following risks for the baby blues or postpartum depression, know what symptoms to watch out for during the postpartum period. Risk factors that may increase the likelihood of getting the baby blues or postpartum depression include: °· Having a personal or family history of depression. °· Having depression while being pregnant. °· Having premenstrual mood issues or mood issues related to oral contraceptives. °· Having a lot of life stress. °· Having marital conflict. °· Lacking   a social support network. °· Having a baby with special needs. °· Having health problems, such as diabetes. ° °What are the signs or symptoms? °Symptoms of baby blues include: °· Brief changes in mood, such as going from extreme happiness to sadness. °· Decreased concentration. °· Difficulty sleeping. °· Crying spells, tearfulness. °· Irritability. °· Anxiety. ° °Symptoms of postpartum depression typically begin within the first month after giving birth. These symptoms include: °· Difficulty sleeping or excessive sleepiness. °· Marked weight loss. °· Agitation. °· Feelings of worthlessness. °· Lack of interest in activity or food. ° °Postpartum psychosis is a very serious condition and can be dangerous. Fortunately, it is rare. Displaying any of the following symptoms is cause for immediate medical attention. Symptoms of postpartum psychosis include: °· Hallucinations and delusions. °· Bizarre or disorganized  behavior. °· Confusion or disorientation. ° °How is this diagnosed? °A diagnosis is made by an evaluation of your symptoms. There are no medical or lab tests that lead to a diagnosis, but there are various questionnaires that a health care provider may use to identify those with the baby blues, postpartum depression, or psychosis. Often, a screening tool called the Edinburgh Postnatal Depression Scale is used to diagnose depression in the postpartum period. °How is this treated? °The baby blues usually goes away on its own in 1-2 weeks. Social support is often all that is needed. You will be encouraged to get adequate sleep and rest. Occasionally, you may be given medicines to help you sleep. °Postpartum depression requires treatment because it can last several months or longer if it is not treated. Treatment may include individual or group therapy, medicine, or both to address any social, physiological, and psychological factors that may play a role in the depression. Regular exercise, a healthy diet, rest, and social support may also be strongly recommended. °Postpartum psychosis is more serious and needs treatment right away. Hospitalization is often needed. °Follow these instructions at home: °· Get as much rest as you can. Nap when the baby sleeps. °· Exercise regularly. Some women find yoga and walking to be beneficial. °· Eat a balanced and nourishing diet. °· Do little things that you enjoy. Have a cup of tea, take a bubble bath, read your favorite magazine, or listen to your favorite music. °· Avoid alcohol. °· Ask for help with household chores, cooking, grocery shopping, or running errands as needed. Do not try to do everything. °· Talk to people close to you about how you are feeling. Get support from your partner, family members, friends, or other new moms. °· Try to stay positive in how you think. Think about the things you are grateful for. °· Do not spend a lot of time alone. °· Only take  over-the-counter or prescription medicine as directed by your health care provider. °· Keep all your postpartum appointments. °· Let your health care provider know if you have any concerns. °Contact a health care provider if: °You are having a reaction to or problems with your medicine. °Get help right away if: °· You have suicidal feelings. °· You think you may harm the baby or someone else. °This information is not intended to replace advice given to you by your health care provider. Make sure you discuss any questions you have with your health care provider. °Document Released: 10/07/2003 Document Revised: 06/10/2015 Document Reviewed: 10/14/2012 °Elsevier Interactive Patient Education © 2017 [employer] ° ° °Preeclampsia and Eclampsia ° °Preeclampsia is a serious condition that may develop during pregnancy. It   is also called toxemia of pregnancy. This condition causes high blood pressure along with other symptoms, such as swelling and headaches. These symptoms may develop as the condition gets worse. Preeclampsia may occur at 20 weeks of pregnancy or later. °Diagnosing and treating preeclampsia early is very important. If not treated early, it can cause serious problems for you and your baby. One problem it can lead to is eclampsia. Eclampsia is a condition that causes muscle jerking or shaking (convulsions or seizures) and other serious problems for the mother. During pregnancy, delivering your baby may be the best treatment for preeclampsia or eclampsia. For most women, preeclampsia and eclampsia symptoms go away after giving birth. °In rare cases, a woman may develop preeclampsia after giving birth (postpartum preeclampsia). This usually occurs within 48 hours after childbirth but may occur up to 6 weeks after giving birth. °What are the causes? °The cause of preeclampsia is not known. °What increases the risk? °The following risk factors make you more likely to develop preeclampsia: °· Being pregnant for  the first time. °· Having had preeclampsia during a past pregnancy. °· Having a family history of preeclampsia. °· Having high blood pressure. °· Being pregnant with more than one baby. °· Being 35 or older. °· Being African-American. °· Having kidney disease or diabetes. °· Having medical conditions such as lupus or blood diseases. °· Being very overweight (obese). °What are the signs or symptoms? °The earliest signs of preeclampsia are: °· High blood pressure. °· Increased protein in your urine. Your health care provider will check for this at every visit before you give birth (prenatal visit). °Other symptoms that may develop as the condition gets worse include: °· Severe headaches. °· Sudden weight gain. °· Swelling of the hands, face, legs, and feet. °· Nausea and vomiting. °· Vision problems, such as blurred or double vision. °· Numbness in the face, arms, legs, and feet. °· Urinating less than usual. °· Dizziness. °· Slurred speech. °· Abdominal pain, especially upper abdominal pain. °· Convulsions or seizures. °How is this diagnosed? °There are no screening tests for preeclampsia. Your health care provider will ask you about symptoms and check for signs of preeclampsia during your prenatal visits. You may also have tests that include: °· Urine tests. °· Blood tests. °· Checking your blood pressure. °· Monitoring your baby’s heart rate. °· Ultrasound. °How is this treated? °You and your health care provider will determine the treatment approach that is best for you. Treatment may include: °· Having more frequent prenatal exams to check for signs of preeclampsia, if you have an increased risk for preeclampsia. °· Medicine to lower your blood pressure. °· Staying in the hospital, if your condition is severe. There, treatment will focus on controlling your blood pressure and the amount of fluids in your body (fluid retention). °· Taking medicine (magnesium sulfate) to prevent seizures. This may be given as an  injection or through an IV. °· Taking a low-dose aspirin during your pregnancy. °· Delivering your baby early, if your condition gets worse. You may have your labor started with medicine (induced), or you may have a cesarean delivery. °Follow these instructions at home: °Eating and drinking ° °· Drink enough fluid to keep your urine pale yellow. °· Avoid caffeine. °Lifestyle °· Do not use any products that contain nicotine or tobacco, such as cigarettes and e-cigarettes. If you need help quitting, ask your health care provider. °· Do not use alcohol or drugs. °· Avoid stress as much as possible. Rest   and get plenty of sleep. °General instructions °· Take over-the-counter and prescription medicines only as told by your health care provider. °· When lying down, lie on your left side. This keeps pressure off your major blood vessels. °· When sitting or lying down, raise (elevate) your feet. Try putting some pillows underneath your lower legs. °· Exercise regularly. Ask your health care provider what kinds of exercise are best for you. °· Keep all follow-up and prenatal visits as told by your health care provider. This is important. °How is this prevented? °There is no known way of preventing preeclampsia or eclampsia from developing. However, to lower your risk of complications and detect problems early: °· Get regular prenatal care. Your health care provider may be able to diagnose and treat the condition early. °· Maintain a healthy weight. Ask your health care provider for help managing weight gain during pregnancy. °· Work with your health care provider to manage any long-term (chronic) health conditions you have, such as diabetes or kidney problems. °· You may have tests of your blood pressure and kidney function after giving birth. °· Your health care provider may have you take low-dose aspirin during your next pregnancy. °Contact a health care provider if: °· You have symptoms that your health care provider told  you may require more treatment or monitoring, such as: °? Headaches. °? Nausea or vomiting. °? Abdominal pain. °? Dizziness. °? Light-headedness. °Get help right away if: °· You have severe: °? Abdominal pain. °? Headaches that do not get better. °? Dizziness. °? Vision problems. °? Confusion. °? Nausea or vomiting. °· You have any of the following: °? A seizure. °? Sudden, rapid weight gain. °? Sudden swelling in your hands, ankles, or face. °? Trouble moving any part of your body. °? Numbness in any part of your body. °? Trouble speaking. °? Abnormal bleeding. °· You faint. °Summary °· Preeclampsia is a serious condition that may develop during pregnancy. It is also called toxemia of pregnancy. °· This condition causes high blood pressure along with other symptoms, such as swelling and headaches. °· Diagnosing and treating preeclampsia early is very important. If not treated early, it can cause serious problems for you and your baby. °· Get help right away if you have symptoms that your health care provider told you to watch for. °This information is not intended to replace advice given to you by your health care provider. Make sure you discuss any questions you have with your health care provider. °Document Released: 12/31/1999 Document Revised: 12/19/2016 Document Reviewed: 08/09/2015 °Elsevier Interactive Patient Education © 2019 [employer] ° °

## 2018-02-20 NOTE — Lactation Note (Signed)
This note was copied from a baby's chart. Lactation Consultation Note  Patient Name: Girl Anayely Cheeseboro [id number] Date: 02/20/2018 Reason for consult: Follow-up assessment;1st time breastfeeding;Term  P1 mother whose infant is now 30 hours old.  Baby was swaddled and being held by grandmother when I arrived.  Mother has been supplementing with large volumes of formula.  She stated that baby is "always hungry" and did some cluster feeding last night.  I explained that this is typical behavior for a baby at this age and to allow baby to breast feed whenever she shows cues.  She is feeding approximately 15 minutes per side and mother feels like latching is going well.  Reviewed milk coming to volume, cues, cluster feeding and breast compressions during feedings.  She stated that she can hear swallows with feeding.  Continued to encourage mother to put baby to breast first and to try to limit the supplementation per the guidelines.  Mother verbalized understanding.  Engorgement prevention/treatment discussed.  Mother has a manual and a DEBP for home use.  She has our OP phone number and support group info for after discharge.  Her mother is present and a good support for her.  She will call for any further questions/concerns.   Maternal Data Formula Feeding for Exclusion: No Has patient been taught Hand Expression?: Yes Does the patient have breastfeeding experience prior to this delivery?: No  Feeding Feeding Type: Breast Fed Nipple Type: Slow - flow  LATCH Score                   Interventions    Lactation Tools Discussed/Used WIC Program: No Initiated by:: Already initiated   Consult Status Consult Status: Complete Date: 02/20/18 Follow-up type: Call as needed    Marvetta Vohs R Jonathon Tan 02/20/2018, 10:06 AM

## 2018-02-24 ENCOUNTER — Other Ambulatory Visit: Payer: Self-pay

## 2018-02-24 ENCOUNTER — Encounter (HOSPITAL_COMMUNITY): Payer: Self-pay

## 2018-02-24 ENCOUNTER — Inpatient Hospital Stay (HOSPITAL_COMMUNITY)
Admission: AD | Admit: 2018-02-24 | Discharge: 2018-02-24 | Disposition: A | Discharge status: Home / Self Care | Payer: Managed Care, Other (non HMO) | Source: Ambulatory Visit | Attending: Obstetrics and Gynecology | Admitting: Obstetrics and Gynecology

## 2018-02-24 DIAGNOSIS — Z711 Person with feared health complaint in whom no diagnosis is made: Secondary | ICD-10-CM | POA: Diagnosis not present

## 2018-02-24 DIAGNOSIS — O864 Pyrexia of unknown origin following delivery: Secondary | ICD-10-CM

## 2018-02-24 DIAGNOSIS — Z0000 Encounter for general adult medical examination without abnormal findings: Secondary | ICD-10-CM

## 2018-02-24 DIAGNOSIS — Z87891 Personal history of nicotine dependence: Secondary | ICD-10-CM | POA: Insufficient documentation

## 2018-02-24 HISTORY — DX: Type 2 diabetes mellitus without complications: E11.9

## 2018-02-24 LAB — URINALYSIS, MICROSCOPIC (REFLEX)

## 2018-02-24 LAB — URINALYSIS, ROUTINE W REFLEX MICROSCOPIC
Bilirubin Urine: NEGATIVE
Glucose, UA: NEGATIVE mg/dL
Ketones, ur: NEGATIVE mg/dL
Nitrite: NEGATIVE
Protein, ur: NEGATIVE mg/dL
Specific Gravity, Urine: 1.025 (ref 1.005–1.030)
pH: 6 (ref 5.0–8.0)

## 2018-02-24 NOTE — Discharge Instructions (Signed)
Pump at least every 3 hours to keep the breasts well emptied. Call the office in the morning if your symptoms are worsening. Take Tylenol 325 mg 2 tablets by mouth every 4 hours if needed for pain. Drink at least 8 8-oz glasses of water every day.

## 2018-02-24 NOTE — MAU Note (Signed)
Pt here with c/o low grade fever and chills; delivered on Monday, vaginally.

## 2018-02-24 NOTE — MAU Provider Note (Signed)
History     CSN: [id number]  Arrival date and time: 02/24/18 7948   First Provider Initiated Contact with Patient 02/24/18 1957      Chief Complaint  Patient presents with  . Fever  . Chills   HPI Deborah James 33 y.o.  Postpartum - Day 6.  Comes to MAU with fever and chills - was worried about her legs swelling and worried she had a blood clot in her leg (saw it on [employer]).  No pain in her abdomen or legs.  No pain in her breasts although they are full.  Has not been keeping herself well hydrated.  No headaches, no blurred vision.  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   OB History    Gravida  3   Para  2   Term  1   Preterm  1   AB  1   Living  1     SAB  1   TAB      Ectopic      Multiple  0   Live Births  1           Past Medical History:  Diagnosis Date  . Asthma   . Chicken pox    As a child  . Diabetes mellitus without complication (HCC)    GDM  . Fetal demise in singleton pregnancy greater than [redacted] weeks gestation, antepartum 09/26/2016  . Genital HSV   . GERD (gastroesophageal reflux disease)   . Hypertension    only with preg     Past Surgical History:  Procedure Laterality Date  . NO PAST SURGERIES      History reviewed. No pertinent family history.  Social History   Tobacco Use  . Smoking status: Former Smoker    Last attempt to quit: 02/27/2016    Years since quitting: 1.9  . Smokeless tobacco: Never Used  Substance Use Topics  . Alcohol use: No  . Drug use: No    Allergies: No Known Allergies  Medications Prior to Admission  Medication Sig Dispense Refill Last Dose  . ibuprofen (ADVIL,MOTRIN) 600 MG tablet Take 1 tablet (600 mg total) by mouth every 6 (six) hours as needed for moderate pain. 30 tablet 0   . prenatal vitamin w/FE, FA (PRENATAL 1 + 1) 27-1 MG TABS tablet Take 1 tablet by mouth daily at 12 noon.   02/17/2018 at Unknown time  .  valACYclovir (VALTREX) 1000 MG tablet Take 1,000 mg by mouth at bedtime.    02/17/2018 at Unknown time    Review of Systems  Constitutional: Positive for chills and fever.  HENT: Negative for congestion and sore throat.   Eyes: Negative for visual disturbance.  Respiratory: Negative for apnea, cough, chest tightness and shortness of breath.   Cardiovascular: Positive for leg swelling. Negative for chest pain.  Gastrointestinal: Negative for abdominal pain.  Genitourinary: Negative for dysuria.  Neurological: Negative for headaches.   Physical Exam   Blood pressure 139/87, pulse 71, temperature 99.7 F (37.6 C), temperature source Oral, resp. rate 18, weight (!) 137.9 kg, SpO2 98 %, unknown if currently breastfeeding.  Physical Exam  Nursing note and vitals reviewed. Constitutional: She is oriented to person, place, and time. She appears well-developed and well-nourished.  HENT:  Head: Normocephalic.  Eyes: EOM are normal.  Neck: Neck supple.  GI: Soft. There is no abdominal tenderness. There is no rebound and no  guarding.  No RUQ pain with palpation  Musculoskeletal: Normal range of motion.     Comments: Left ankle swollen 1+ Right ankle not swollen Negative Homan's bilaterally  Neurological: She is alert and oriented to person, place, and time.  Skin: Skin is warm and dry.  Psychiatric: She has a normal mood and affect.   Vitals:   02/24/18 1914 02/24/18 2044  BP: 139/87 136/86  Pulse: 71 67  Resp: 18   Temp: 99.7 F (37.6 C) 99.1 F (37.3 C)  SpO2: 98%     MAU Course  Procedures Results for orders placed or performed during the hospital encounter of 02/24/18 (from the past 24 hour(s))  Urinalysis, Routine w reflex microscopic     Status: Abnormal   Collection Time: 02/24/18  8:11 PM  Result Value Ref Range   Color, Urine YELLOW YELLOW   APPearance CLEAR CLEAR   Specific Gravity, Urine 1.025 1.005 - 1.030   pH 6.0 5.0 - 8.0   Glucose, UA NEGATIVE NEGATIVE mg/dL    Hgb urine dipstick MODERATE (A) NEGATIVE   Bilirubin Urine NEGATIVE NEGATIVE   Ketones, ur NEGATIVE NEGATIVE mg/dL   Protein, ur NEGATIVE NEGATIVE mg/dL   Nitrite NEGATIVE NEGATIVE   Leukocytes, UA TRACE (A) NEGATIVE  Urinalysis, Microscopic (reflex)     Status: Abnormal   Collection Time: 02/24/18  8:11 PM  Result Value Ref Range   RBC / HPF 0-5 0 - 5 RBC/hpf   WBC, UA 6-10 0 - 5 WBC/hpf   Bacteria, UA RARE (A) NONE SEEN   Squamous Epithelial / LPF 0-5 0 - 5    MDM Client is pumping every 3 hours during the day and sometimes at night.  No pain in breasts except for fullness which is relieved by pumping.  Advised to continue to pump on time and monitor for any lumps or hard areas in the breast.  At this time, her blood pressure is below 140/90.  Has a home visit planned on Tuesday.  Reviewed the signs of worsening blood pressure - headache, blurred vision or other visual changes, RUQ pain or worsening edema of lower extremities.  Will not check preeclampsia labs today.  Client had no chills in MAU and her temp was never over 99.7. Likely this can be attributed to her breastfeeding - she is pumping and while there is no identified areas of soreness in her breasts, she could be getting a fever due to not pumping as frequently as she needs.  Also she is not drinking PO fluids well and could be slightly dehydrated.  Will send urine culture to make sure there is no UTI.  Assessment and Plan  Normal postpartum exam - is not sick appearing Temp is 99 but does not meet the criteria for a true fever. Slight dehydration - SG 1.025  Plan Urine culture pending Pump at least every 3 hours to keep the breasts well emptied. Call the office in the morning if your symptoms are worsening. Take Tylenol 325 mg 2 tablets by mouth every 4 hours if needed for pain. Drink at least 8 8-oz glasses of water every day. Client is pleased with her visit today and is in agreement with this plan of  care.   L  02/24/2018, 8:02 PM

## 2018-02-26 LAB — URINE CULTURE

## 2018-10-09 ENCOUNTER — Other Ambulatory Visit: Payer: Self-pay

## 2018-10-09 ENCOUNTER — Other Ambulatory Visit: Payer: Self-pay | Admitting: Podiatry

## 2018-10-09 ENCOUNTER — Ambulatory Visit: Payer: Managed Care, Other (non HMO)

## 2018-10-09 ENCOUNTER — Encounter: Payer: Self-pay | Admitting: Podiatry

## 2018-10-09 ENCOUNTER — Ambulatory Visit (INDEPENDENT_AMBULATORY_CARE_PROVIDER_SITE_OTHER): Payer: Managed Care, Other (non HMO) | Admitting: Podiatry

## 2018-10-09 ENCOUNTER — Ambulatory Visit (INDEPENDENT_AMBULATORY_CARE_PROVIDER_SITE_OTHER): Payer: Managed Care, Other (non HMO)

## 2018-10-09 DIAGNOSIS — G5751 Tarsal tunnel syndrome, right lower limb: Secondary | ICD-10-CM

## 2018-10-09 DIAGNOSIS — M722 Plantar fascial fibromatosis: Secondary | ICD-10-CM

## 2018-10-09 DIAGNOSIS — M79671 Pain in right foot: Secondary | ICD-10-CM

## 2018-10-09 DIAGNOSIS — B351 Tinea unguium: Secondary | ICD-10-CM

## 2018-10-09 LAB — HEPATIC FUNCTION PANEL
AG Ratio: 1.3 (calc) (ref 1.0–2.5)
ALT: 12 U/L (ref 6–29)
AST: 11 U/L (ref 10–30)
Albumin: 3.9 g/dL (ref 3.6–5.1)
Alkaline phosphatase (APISO): 69 U/L (ref 31–125)
Bilirubin, Direct: 0.1 mg/dL (ref 0.0–0.2)
Globulin: 3 g/dL (calc) (ref 1.9–3.7)
Indirect Bilirubin: 0.2 mg/dL (calc) (ref 0.2–1.2)
Total Bilirubin: 0.3 mg/dL (ref 0.2–1.2)
Total Protein: 6.9 g/dL (ref 6.1–8.1)

## 2018-10-09 MED ORDER — METHYLPREDNISOLONE 4 MG PO TBPK: ORAL_TABLET | ORAL | 0 refills | Status: DC

## 2018-10-09 NOTE — Patient Instructions (Signed)
Plantar Fasciitis Rehab Ask your health care provider which exercises are safe for you. Do exercises exactly as told by your health care provider and adjust them as directed. It is normal to feel mild stretching, pulling, tightness, or discomfort as you do these exercises. Stop right away if you feel sudden pain or your pain gets worse. Do not begin these exercises until told by your health care provider. Stretching and range-of-motion exercises These exercises warm up your muscles and joints and improve the movement and flexibility of your foot. These exercises also help to relieve pain. Plantar fascia stretch  1. Sit with your left / right leg crossed over your opposite knee. 2. Hold your heel with one hand with that thumb near your arch. With your other hand, hold your toes and gently pull them back toward the top of your foot. You should feel a stretch on the bottom of your toes or your foot (plantar fascia) or both. 3. Hold this stretch for__________ seconds. 4. Slowly release your toes and return to the starting position. Repeat __________ times. Complete this exercise __________ times a day. Gastrocnemius stretch, standing This exercise is also called a calf (gastroc) stretch. It stretches the muscles in the back of the upper calf. 1. Stand with your hands against a wall. 2. Extend your left / right leg behind you, and bend your front knee slightly. 3. Keeping your heels on the floor and your back knee straight, shift your weight toward the wall. Do not arch your back. You should feel a gentle stretch in your upper left / right calf. 4. Hold this position for __________ seconds. Repeat __________ times. Complete this exercise __________ times a day. Soleus stretch, standing This exercise is also called a calf (soleus) stretch. It stretches the muscles in the back of the lower calf. 1. Stand with your hands against a wall. 2. Extend your left / right leg behind you, and bend your front  knee slightly. 3. Keeping your heels on the floor, bend your back knee and shift your weight slightly over your back leg. You should feel a gentle stretch deep in your lower calf. 4. Hold this position for __________ seconds. Repeat __________ times. Complete this exercise __________ times a day. Gastroc and soleus stretch, standing step This exercise stretches the muscles in the back of the lower leg. These muscles are in the upper calf (gastrocnemius) and the lower calf (soleus). 1. Stand with the ball of your left / right foot on a step. The ball of your foot is on the walking surface, right under your toes. 2. Keep your other foot firmly on the same step. 3. Hold on to the wall or a railing for balance. 4. Slowly lift your other foot, allowing your body weight to press your left / right heel down over the edge of the step. You should feel a stretch in your left / right calf. 5. Hold this position for __________ seconds. 6. Return both feet to the step. 7. Repeat this exercise with a slight bend in your left / right knee. Repeat __________ times with your left / right knee straight and __________ times with your left / right knee bent. Complete this exercise __________ times a day. Balance exercise This exercise builds your balance and strength control of your arch to help take pressure off your plantar fascia. Single leg stand If this exercise is too easy, you can try it with your eyes closed or while standing on a pillow. 1.   Without shoes, stand near a railing or in a doorway. You may hold on to the railing or door frame as needed. 2. Stand on your left / right foot. Keep your big toe down on the floor and try to keep your arch lifted. Do not let your foot roll inward. 3. Hold this position for __________ seconds. Repeat __________ times. Complete this exercise __________ times a day. This information is not intended to replace advice given to you by your health care provider. Make sure  you discuss any questions you have with your health care provider. Document Released: 01/02/2005 Document Revised: 04/25/2018 Document Reviewed: 10/31/2017 Elsevier Patient Education  2020 [employer]  

## 2018-10-09 NOTE — Progress Notes (Signed)
Subjective:  Patient ID: Deborah James, female    DOB: 03/22/1985,  MRN: [id number]  Chief Complaint  Patient presents with  . Foot Pain    pt has right bottom of heel pain, located on the medial side of the right heel, also located on the bottom of the right heel, pt states that pain has become elevated after giving birth.    33 y.o. female presents with the above complaint. She also has secondary complaints of onychomycosis of both all the toes. They have been progressively getting worse and she is interested in treatment options.    Review of Systems: Negative except as noted in the HPI. Denies N/V/F/Ch.  Past Medical History:  Diagnosis Date  . Asthma   . Chicken pox    As a child  . Diabetes mellitus without complication (HCC)    GDM  . Fetal demise in singleton pregnancy greater than [redacted] weeks gestation, antepartum 09/26/2016  . Genital HSV   . GERD (gastroesophageal reflux disease)   . Hypertension    only with preg     Current Outpatient Medications:  .  ibuprofen (ADVIL,MOTRIN) 600 MG tablet, Take 1 tablet (600 mg total) by mouth every 6 (six) hours as needed for moderate pain., Disp: 30 tablet, Rfl: 0 .  prenatal vitamin w/FE, FA (PRENATAL 1 + 1) 27-1 MG TABS tablet, Take 1 tablet by mouth daily at 12 noon., Disp: , Rfl:  .  valACYclovir (VALTREX) 1000 MG tablet, Take 1,000 mg by mouth at bedtime. , Disp: , Rfl:  .  methylPREDNISolone (MEDROL DOSEPAK) 4 MG TBPK tablet, Use as directed, Disp: 1 each, Rfl: 0  Social History   Tobacco Use  Smoking Status Former Smoker  . Quit date: 02/27/2016  . Years since quitting: 2.6  Smokeless Tobacco Never Used    No Known Allergies Objective:  There were no vitals filed for this visit. There is no height or weight on file to calculate BMI. Constitutional Well developed. Well nourished.  Vascular Dorsalis pedis pulses palpable bilaterally. Posterior tibial pulses palpable bilaterally. Capillary refill normal to all digits.   No cyanosis or clubbing noted. Pedal hair growth normal.  Neurologic Normal speech. Oriented to person, place, and time. Epicritic sensation to light touch grossly present bilaterally.  Dermatologic Thickened/elongated/dark discolored toenails x 10 No open wounds. No skin lesions.  Orthopedic: Normal joint ROM without pain or crepitus bilaterally. No visible deformities. Tender to palpation at the calcaneal tuber right. No pain with calcaneal squeeze right. Ankle ROM diminished range of motion right. Silfverskiold Test: positive bilaterally. Positive Tinel signs on the medial aspect of the ankle.     Radiographs: Taken and reviewed. No acute fractures or dislocations. No evidence of stress fracture.  Plantar heel spur present. Posterior heel spur present.   Assessment:   1. Tarsal tunnel syndrome, right   2. Foot pain, right   3. Plantar fasciitis   4. Onychomycosis due to dermatophyte    Plan:  Patient was evaluated and treated and all questions answered.  Plantar Fasciitis, right - XR reviewed as above.  - Educated on icing and stretching. Instructions given.  - Injection delivered to the plantar fascia as below. - DME: Plantar Fascial Brace - Pharmacologic management: Medrol Dose Pak. Educated on risks/benefits and proper taking of medication. Tarsal tunnel Syndrome (possible baxter's nerve entrapment) -Ankle brace dispensed  Onychomycosis x 10 -Hepatic function tests ordered prior to start of Lamisil treatment -Will start the treament during next visit if LFT normal  Procedure: Injection Tendon/Ligament Location: Right plantar fascia at the glabrous junction; medial approach. Skin Prep: alcohol Injectate: 0.5 cc 0.5% marcaine plain, 0.5 cc of 1% Lidocaine, 0.5 cc kenalog 10. Disposition: Patient tolerated procedure well. Injection site dressed with a band-aid.  Return in about 3 weeks (around 10/30/2018).

## 2018-11-01 ENCOUNTER — Other Ambulatory Visit: Payer: Self-pay

## 2018-11-01 ENCOUNTER — Ambulatory Visit (INDEPENDENT_AMBULATORY_CARE_PROVIDER_SITE_OTHER): Payer: BC Managed Care – PPO | Admitting: Podiatry

## 2018-11-01 DIAGNOSIS — B351 Tinea unguium: Secondary | ICD-10-CM

## 2018-11-01 DIAGNOSIS — M79671 Pain in right foot: Secondary | ICD-10-CM

## 2018-11-01 DIAGNOSIS — M722 Plantar fascial fibromatosis: Secondary | ICD-10-CM

## 2018-11-01 MED ORDER — TERBINAFINE HCL 250 MG PO TABS: 250.0000 mg | ORAL_TABLET | Freq: Every day | ORAL | 0 refills | Status: AC

## 2018-11-03 ENCOUNTER — Encounter: Payer: Self-pay | Admitting: Podiatry

## 2018-11-03 NOTE — Progress Notes (Signed)
Subjective:  Patient ID: Deborah James, female    DOB: 02/15/1985,  MRN: [id number]  Chief Complaint  Patient presents with  . Foot Pain    pt is here for a f/u on right foot pain, pt states that she was doing better until about last week where she stated the pain has come back, but not as bad as how it used to be    33 y.o. female presents with the above complaint.  She states that she is doing 80% better since her last injection last time I saw her.  She states the Tri-Lock brace had definitely helped.  The injection has also helped as well.  She is also here to go over her LFTs and start on terbinafine.  She denies any other acute complaints.  Today she states that she still has some heel pain but has considerably improved.  She has been doing her stretching exercises as well.  This has helped her tremendously as well.   Review of Systems: Negative except as noted in the HPI. Denies N/V/F/Ch.  Past Medical History:  Diagnosis Date  . Asthma   . Chicken pox    As a child  . Diabetes mellitus without complication (HCC)    GDM  . Fetal demise in singleton pregnancy greater than [redacted] weeks gestation, antepartum 09/26/2016  . Genital HSV   . GERD (gastroesophageal reflux disease)   . Hypertension    only with preg     Current Outpatient Medications:  .  ibuprofen (ADVIL,MOTRIN) 600 MG tablet, Take 1 tablet (600 mg total) by mouth every 6 (six) hours as needed for moderate pain., Disp: 30 tablet, Rfl: 0 .  methylPREDNISolone (MEDROL DOSEPAK) 4 MG TBPK tablet, Use as directed, Disp: 1 each, Rfl: 0 .  prenatal vitamin w/FE, FA (PRENATAL 1 + 1) 27-1 MG TABS tablet, Take 1 tablet by mouth daily at 12 noon., Disp: , Rfl:  .  valACYclovir (VALTREX) 1000 MG tablet, Take 1,000 mg by mouth at bedtime. , Disp: , Rfl:  .  terbinafine (LAMISIL) 250 MG tablet, Take 1 tablet (250 mg total) by mouth daily., Disp: 90 tablet, Rfl: 0  Social History   Tobacco Use  Smoking Status Former Smoker  . Quit  date: 02/27/2016  . Years since quitting: 2.6  Smokeless Tobacco Never Used    No Known Allergies Objective:  There were no vitals filed for this visit. There is no height or weight on file to calculate BMI. Constitutional Well developed. Well nourished.  Vascular Dorsalis pedis pulses palpable bilaterally. Posterior tibial pulses palpable bilaterally. Capillary refill normal to all digits.  No cyanosis or clubbing noted. Pedal hair growth normal.  Neurologic Normal speech. Oriented to person, place, and time. Epicritic sensation to light touch grossly present bilaterally.  Dermatologic Nails well groomed and normal in appearance. No open wounds. No skin lesions.  Orthopedic: Normal joint ROM without pain or crepitus bilaterally. No visible deformities. Tender to palpation at the calcaneal tuber right. No pain with calcaneal squeeze right. Ankle ROM diminished range of motion right. Silfverskiold Test: positive right.   Radiographs: None  Assessment:  No diagnosis found. Plan:  Patient was evaluated and treated and all questions answered.  Plantar Fasciitis, right - XR reviewed as above.  - Educated on icing and stretching. Instructions given.  - Injection delivered to the plantar fascia as below. - DME: Night brace - Pharmacologic management: Meloxicam/Medrol Dose Pak. Educated on risks/benefits and proper taking of medication.  Bilateral onychomycosis  toenails x10 -Her LFTs were reviewed.  They were normal.  It was determined that patient will benefit from starting Lamisil therapy. -She will be on Lamisil terbinafine for 3 months afterward she will come see me to decide whether she needs to be on course for another 3 months or discontinue the therapy.  Procedure: Injection Tendon/Ligament Location: Right plantar fascia at the glabrous junction; medial approach. Skin Prep: alcohol Injectate: 0.5 cc 0.5% marcaine plain, 0.5 cc of 1% Lidocaine, 0.5 cc kenalog 10.  Disposition: Patient tolerated procedure well. Injection site dressed with a band-aid.  No follow-ups on file.

## 2018-12-13 ENCOUNTER — Encounter: Payer: Self-pay | Admitting: Adult Health Nurse Practitioner

## 2018-12-13 ENCOUNTER — Ambulatory Visit: Payer: BC Managed Care – PPO | Admitting: Adult Health Nurse Practitioner

## 2018-12-13 ENCOUNTER — Other Ambulatory Visit: Payer: Self-pay

## 2018-12-13 VITALS — BP 140/85 | HR 82 | Temp 98.1°F | Ht 73.0 in | Wt 298.0 lb

## 2018-12-13 DIAGNOSIS — J45909 Unspecified asthma, uncomplicated: Secondary | ICD-10-CM | POA: Insufficient documentation

## 2018-12-13 DIAGNOSIS — F4323 Adjustment disorder with mixed anxiety and depressed mood: Secondary | ICD-10-CM | POA: Diagnosis not present

## 2018-12-13 DIAGNOSIS — R5383 Other fatigue: Secondary | ICD-10-CM | POA: Diagnosis not present

## 2018-12-13 DIAGNOSIS — J4520 Mild intermittent asthma, uncomplicated: Secondary | ICD-10-CM | POA: Diagnosis not present

## 2018-12-13 DIAGNOSIS — F4322 Adjustment disorder with anxiety: Secondary | ICD-10-CM | POA: Diagnosis not present

## 2018-12-13 HISTORY — DX: Adjustment disorder with mixed anxiety and depressed mood: F43.23

## 2018-12-13 HISTORY — DX: Other fatigue: R53.83

## 2018-12-13 MED ORDER — CLONAZEPAM 0.5 MG PO TABS: 0.5000 mg | ORAL_TABLET | Freq: Two times a day (BID) | ORAL | 1 refills | Status: DC | PRN

## 2018-12-13 MED ORDER — ALBUTEROL SULFATE HFA 108 (90 BASE) MCG/ACT IN AERS: 2.0000 | INHALATION_SPRAY | Freq: Four times a day (QID) | RESPIRATORY_TRACT | 3 refills | Status: AC | PRN

## 2018-12-13 MED ORDER — PAROXETINE HCL 20 MG PO TABS: 20.0000 mg | ORAL_TABLET | Freq: Every day | ORAL | 1 refills | Status: AC

## 2018-12-13 NOTE — Progress Notes (Signed)
Subjective:    Patient ID: Deborah James, female    DOB: May 13, 1985, 33 y.o.   MRN: [id number]  HPI  Patient is a very pleasant 33 year old female who gave birth approximately 9 months ago.  Since then she has had a very complicated life with her partner getting very sick from Covid, the difficulty of caring for an infant, as well as financial concerns.  Prior to this she had a stillbirth at 33 months, got pregnant immediately after and had a miscarriage, and then got pregnant the third time where she gave birth to a healthy baby girl.  She is finding that she is still dealing with some of the grief from these losses and with the stress of Covid, her 10-hour day drop, and the anxiety of knowing whether she is doing the right thing as a mother.  She has a history of asthma, high risk pregnancy, and currently is struggling with work stress and being able to find childcare for her daughter.  Due to her asthma, other respiratory factors, and recent high risk pregnancy, she has high risk for developing Covid.  She is interested in starting something for her anxiety.  She feels like she cannot turn off her mind and that she is only getting a couple of hours of sleep due to her child not being on a schedule yet.  She is fatigued.  Denies chills fevers or night sweats.  Some sensitivity to cold.  Review of Systems  Constitutional: Positive for fatigue. Negative for activity change, appetite change and chills.  Respiratory: Negative for cough and chest tightness.   Cardiovascular: Negative for chest pain and leg swelling.  Genitourinary: Negative.   Skin: Negative.   Neurological: Negative for dizziness, weakness and headaches.  Hematological: Negative.   Psychiatric/Behavioral: Positive for dysphoric mood. The patient is nervous/anxious.        Objective:   Physical Exam  Physical Exam  Constitutional: Oriented to person, place, and time. Appears well-developed and well-nourished.  HENT:  Head:  Normocephalic and atraumatic.  Eyes: Conjunctivae and EOM are normal.  Cardiovascular: Normal rate, regular rhythm, normal heart sounds and intact distal pulses.  No murmur heard. Pulmonary/Chest: Effort normal and breath sounds normal. No stridor. No respiratory distress. Has no wheezes.  Neurological: Is alert and oriented to person, place, and time.  Skin: Skin is warm. Capillary refill takes less than 2 seconds.  Psychiatric: Has a normal mood and affect. Behavior is normal. Judgment and thought content normal.       Assessment & Plan:   1. Fatigue, unspecified type   2. Adjustment disorder with anxious mood   3. Adjustment disorder with mixed anxiety and depressed mood   4. Mild intermittent asthma without complication      Lab Orders     CBC with Differential     CMP14+EGFR     Thyroid Panel With TSH   Meds ordered this encounter  Medications  . PARoxetine (PAXIL) 20 MG tablet    Sig: Take 1 tablet (20 mg total) by mouth daily.    Dispense:  30 tablet    Refill:  1  . clonazePAM (KLONOPIN) 0.5 MG tablet    Sig: Take 1 tablet (0.5 mg total) by mouth 2 (two) times daily as needed for anxiety.    Dispense:  30 tablet    Refill:  1  . albuterol (VENTOLIN HFA) 108 (90 Base) MCG/ACT inhaler    Sig: Inhale 2 puffs into the lungs every 6 (six)  hours as needed for wheezing or shortness of breath.    Dispense:  18 g    Refill:  3    Discussed anxiety and depression.  Discussed how they are on the same scale.  I have given her instructions on the medications.  We will start her on Paxil 20 mg daily and Klonopin for severe stress anxiety or panic attacks.  Have cautioned her from taking the Klonopin daily.  She verbalized understanding.  Also recommended that she start to establish with a counselor to deal with some of her grief in addition to her stress.  She is amenable to that plan.  We will follow-up in 4 weeks to see how she is adjusted to medications.  I have given her a  note for work that she is high risk and should be working from home from a medical standpoint.  Glyn Ade

## 2018-12-13 NOTE — Patient Instructions (Addendum)
If you have lab work done today you will be contacted with your lab results within the next 2 weeks.  If you have not heard from [place] then please contact us. The fastest way to get your results is to register for My Chart.   IF you received an x-ray today, you will receive an invoice from [facility]. Please contact [facility] at [phone] with questions or concerns regarding your invoice.   IF you received labwork today, you will receive an invoice from [facility]. Please contact [facility] at [phone] with questions or concerns regarding your invoice.   Our billing staff will not be able to assist you with questions regarding bills from these companies.  You will be contacted with the lab results as soon as they are available. The fastest way to get your results is to activate your My Chart account. Instructions are located on the last page of this paperwork. If you have not heard from [place] regarding the results in 2 weeks, please contact this office.     Managing Loss, Adult People experience loss in many different ways throughout their lives. Events such as moving, changing jobs, and losing friends can create a sense of loss. The loss may be as serious as a major health change, divorce, death of a pet, or death of a loved one. All of these types of loss are likely to create a physical and emotional reaction known as grief. Grief is the result of a major change or an absence of something or someone that you count on. Grief is a normal reaction to loss. A variety of factors can affect your grieving experience, including:  The nature of your loss.  Your relationship to what or whom you lost.  Your understanding of grief and how to manage it.  Your support system. How to manage lifestyle changes Keep to your normal routine as much as possible.  If you have trouble focusing or doing normal activities, it is acceptable to take some time away from your normal  routine.  Spend time with friends and loved ones.  Eat a healthy diet, get plenty of sleep, and rest when you feel tired. How to recognize changes  The way that you deal with your grief will affect your ability to function as you normally do. When grieving, you may experience these changes:  Numbness, shock, sadness, anxiety, anger, denial, and guilt.  Thoughts about death.  Unexpected crying.  A physical sensation of emptiness in your stomach.  Problems sleeping and eating.  Tiredness (fatigue).  Loss of interest in normal activities.  Dreaming about or imagining seeing the person who died.  A need to remember what or whom you lost.  Difficulty thinking about anything other than your loss for a period of time.  Relief. If you have been expecting the loss for a while, you may feel a sense of relief when it happens. Follow these instructions at home:  Activity Express your feelings in healthy ways, such as:  Talking with others about your loss. It may be helpful to find others who have had a similar loss, such as a support group.  Writing down your feelings in a journal.  Doing physical activities to release stress and emotional energy.  Doing creative activities like painting, sculpting, or playing or listening to music.  Practicing resilience. This is the ability to recover and adjust after facing challenges. Reading some resources that encourage resilience may help you to learn ways to practice  those behaviors. General instructions  Be patient with yourself and others. Allow the grieving process to happen, and remember that grieving takes time. ? It is likely that you may never feel completely done with some grief. You may find a way to move on while still cherishing memories and feelings about your loss. ? Accepting your loss is a process. It can take months or longer to adjust.  Keep all follow-up visits as told by your health care provider. This is  important. Where to find support To get support for managing loss:  Ask your health care provider for help and recommendations, such as grief counseling or therapy.  Think about joining a support group for people who are managing a loss. Where to find more information You can find more information about managing loss from:  American Society of Clinical Oncology: www.cancer.net  American Psychological Association: TVStereos.ch Contact a health care provider if:  Your grief is extreme and keeps getting worse.  You have ongoing grief that does not improve.  Your body shows symptoms of grief, such as illness.  You feel depressed, anxious, or lonely. Get help right away if:  You have thoughts about hurting yourself or others. If you ever feel like you may hurt yourself or others, or have thoughts about taking your own life, get help right away. You can go to your nearest emergency department or call:  Your local emergency services (911 in the [place]  A suicide crisis helpline, such as the [facility] at 289 682 1602. This is open 24 hours a day. Summary  Grief is the result of a major change or an absence of someone or something that you count on. Grief is a normal reaction to loss.  The depth of grief and the period of recovery depend on the type of loss and your ability to adjust to the change and process your feelings.  Processing grief requires patience and a willingness to accept your feelings and talk about your loss with people who are supportive.  It is important to find resources that work for you and to realize that people experience grief differently. There is not one grieving process that works for everyone in the same way.  Be aware that when grief becomes extreme, it can lead to more severe issues like isolation, depression, anxiety, or suicidal thoughts. Talk with your health care provider if you have any of these issues. This information  is not intended to replace advice given to you by your health care provider. Make sure you discuss any questions you have with your health care provider. Document Released: 05/18/2016 Document Revised: 03/08/2018 Document Reviewed: 05/18/2016 Elsevier Patient Education  [place].

## 2018-12-14 LAB — THYROID PANEL WITH TSH
Free Thyroxine Index: 2 (ref 1.2–4.9)
T3 Uptake Ratio: 26 % (ref 24–39)
T4, Total: 7.7 ug/dL (ref 4.5–12.0)
TSH: 1.03 u[IU]/mL (ref 0.450–4.500)

## 2018-12-14 LAB — CBC WITH DIFFERENTIAL/PLATELET
Basophils Absolute: 0.1 10*3/uL (ref 0.0–0.2)
Basos: 1 %
EOS (ABSOLUTE): 0.1 10*3/uL (ref 0.0–0.4)
Eos: 1 %
Hematocrit: 37.9 % (ref 34.0–46.6)
Hemoglobin: 12.9 g/dL (ref 11.1–15.9)
Immature Grans (Abs): 0 10*3/uL (ref 0.0–0.1)
Immature Granulocytes: 0 %
Lymphocytes Absolute: 2.9 10*3/uL (ref 0.7–3.1)
Lymphs: 44 %
MCH: 30.6 pg (ref 26.6–33.0)
MCHC: 34 g/dL (ref 31.5–35.7)
MCV: 90 fL (ref 79–97)
Monocytes Absolute: 0.5 10*3/uL (ref 0.1–0.9)
Monocytes: 8 %
Neutrophils Absolute: 3 10*3/uL (ref 1.4–7.0)
Neutrophils: 46 %
Platelets: 360 10*3/uL (ref 150–450)
RBC: 4.22 x10E6/uL (ref 3.77–5.28)
RDW: 12 % (ref 11.7–15.4)
WBC: 6.6 10*3/uL (ref 3.4–10.8)

## 2018-12-14 LAB — CMP14+EGFR
ALT: 16 IU/L (ref 0–32)
AST: 13 IU/L (ref 0–40)
Albumin/Globulin Ratio: 1.6 (ref 1.2–2.2)
Albumin: 4.2 g/dL (ref 3.8–4.8)
Alkaline Phosphatase: 88 IU/L (ref 39–117)
BUN/Creatinine Ratio: 11 (ref 9–23)
BUN: 10 mg/dL (ref 6–20)
Bilirubin Total: 0.2 mg/dL (ref 0.0–1.2)
CO2: 22 mmol/L (ref 20–29)
Calcium: 9.3 mg/dL (ref 8.7–10.2)
Chloride: 102 mmol/L (ref 96–106)
Creatinine, Ser: 0.87 mg/dL (ref 0.57–1.00)
GFR calc Af Amer: 102 mL/min/{1.73_m2} (ref >59)
GFR calc non Af Amer: 88 mL/min/{1.73_m2} (ref >59)
Globulin, Total: 2.7 g/dL (ref 1.5–4.5)
Glucose: 96 mg/dL (ref 65–99)
Potassium: 4.3 mmol/L (ref 3.5–5.2)
Sodium: 139 mmol/L (ref 134–144)
Total Protein: 6.9 g/dL (ref 6.0–8.5)

## 2018-12-26 ENCOUNTER — Telehealth: Payer: Self-pay | Admitting: Adult Health Nurse Practitioner

## 2018-12-26 NOTE — Telephone Encounter (Signed)
Referral info sent via fax to [facility] via fax 12.10.2020 from [place]

## 2018-12-30 ENCOUNTER — Telehealth: Payer: Self-pay | Admitting: Podiatry

## 2018-12-30 NOTE — Telephone Encounter (Signed)
Called patient and left voicemail to call back and schedule appt.  Pt sent MyChart message requesting appt for foot pain

## 2019-01-03 ENCOUNTER — Ambulatory Visit (INDEPENDENT_AMBULATORY_CARE_PROVIDER_SITE_OTHER): Payer: BC Managed Care – PPO | Admitting: Adult Health Nurse Practitioner

## 2019-01-03 ENCOUNTER — Other Ambulatory Visit: Payer: Self-pay

## 2019-01-03 ENCOUNTER — Ambulatory Visit (INDEPENDENT_AMBULATORY_CARE_PROVIDER_SITE_OTHER): Payer: BC Managed Care – PPO | Admitting: Podiatry

## 2019-01-03 ENCOUNTER — Encounter: Payer: Self-pay | Admitting: Adult Health Nurse Practitioner

## 2019-01-03 ENCOUNTER — Encounter: Payer: Self-pay | Admitting: Podiatry

## 2019-01-03 VITALS — BP 130/84 | HR 65 | Temp 98.1°F | Ht 73.0 in | Wt 291.6 lb

## 2019-01-03 DIAGNOSIS — M79671 Pain in right foot: Secondary | ICD-10-CM | POA: Diagnosis not present

## 2019-01-03 DIAGNOSIS — F4323 Adjustment disorder with mixed anxiety and depressed mood: Secondary | ICD-10-CM

## 2019-01-03 DIAGNOSIS — M722 Plantar fascial fibromatosis: Secondary | ICD-10-CM

## 2019-01-03 MED ORDER — CLONAZEPAM 0.5 MG PO TABS: 0.5000 mg | ORAL_TABLET | Freq: Two times a day (BID) | ORAL | 1 refills | Status: AC | PRN

## 2019-01-03 NOTE — Patient Instructions (Signed)
° ° ° °  If you have lab work done today you will be contacted with your lab results within the next 2 weeks.  If you have not heard from us then please contact us. The fastest way to get your results is to register for My Chart. ° ° °IF you received an x-ray today, you will receive an invoice from [facility]. Please contact [facility] at [phone] with questions or concerns regarding your invoice.  ° °IF you received labwork today, you will receive an invoice from [facility]. Please contact [facility] at [phone] with questions or concerns regarding your invoice.  ° °Our billing staff will not be able to assist you with questions regarding bills from these companies. ° °You will be contacted with the lab results as soon as they are available. The fastest way to get your results is to activate your My Chart account. Instructions are located on the last page of this paperwork. If you have not heard from us regarding the results in 2 weeks, please contact this office. °  ° ° ° °

## 2019-01-03 NOTE — Progress Notes (Signed)
Subjective:  Patient ID: Deborah James, female    DOB: [birth year],  MRN: [id number]  Chief Complaint  Patient presents with  . Foot Pain    pt is here for right heel pain f/u, pt states that her right heel is in pain again, pt also states that the heel pain has come back as well. pt also states that her left heel pain is elevated when she is not wearing the trilock brace    33 y.o. female presents with the above complaint.  Patient states her right heel pain came acutely back.  She states that she is trying to do her stretching exercising and wearing her bracing but is not making it go away.  She was doing much better with greater than 95% resolved when she last came to see me.  However plantar fasciitis pain started back up again.  She never saw Liliane Channel to get the orthotics made.  She states her pain is 6 out of 10 without the brace with the brace is much better controlled.  She denies any other acute complaints.  Review of Systems: Negative except as noted in the HPI. Denies N/V/F/Ch.  Past Medical History:  Diagnosis Date  . Adjustment disorder with mixed anxiety and depressed mood 12/13/2018  . Asthma   . Chicken pox    As a child  . Diabetes mellitus without complication (HCC)    GDM  . Fatigue 12/13/2018  . Fetal demise in singleton pregnancy greater than [redacted] weeks gestation, antepartum 09/26/2016  . Genital HSV   . GERD (gastroesophageal reflux disease)   . Hypertension    only with preg     Current Outpatient Medications:  .  albuterol (VENTOLIN HFA) 108 (90 Base) MCG/ACT inhaler, Inhale 2 puffs into the lungs every 6 (six) hours as needed for wheezing or shortness of breath., Disp: 18 g, Rfl: 3 .  clonazePAM (KLONOPIN) 0.5 MG tablet, Take 1 tablet (0.5 mg total) by mouth 2 (two) times daily as needed for anxiety., Disp: 30 tablet, Rfl: 1 .  PARoxetine (PAXIL) 20 MG tablet, Take 1 tablet (20 mg total) by mouth daily., Disp: 30 tablet, Rfl: 1 .  terbinafine (LAMISIL) 250 MG tablet,  Take 1 tablet (250 mg total) by mouth daily., Disp: 90 tablet, Rfl: 0 .  valACYclovir (VALTREX) 1000 MG tablet, Take 1,000 mg by mouth at bedtime. , Disp: , Rfl:   Social History   Tobacco Use  Smoking Status Former Smoker  . Quit date: 02/27/2016  . Years since quitting: 2.8  Smokeless Tobacco Never Used    No Known Allergies Objective:  There were no vitals filed for this visit. There is no height or weight on file to calculate BMI. Constitutional Well developed. Well nourished.  Vascular Dorsalis pedis pulses palpable bilaterally. Posterior tibial pulses palpable bilaterally. Capillary refill normal to all digits.  No cyanosis or clubbing noted. Pedal hair growth normal.  Neurologic Normal speech. Oriented to person, place, and time. Epicritic sensation to light touch grossly present bilaterally.  Dermatologic Nails well groomed and normal in appearance. No open wounds. No skin lesions.  Orthopedic: Normal joint ROM without pain or crepitus bilaterally. No visible deformities. Tender to palpation at the calcaneal tuber right. No pain with calcaneal squeeze right. Ankle ROM diminished range of motion right. Silfverskiold Test: positive right.   Radiographs: None  Assessment:   1. Plantar fasciitis   2. Foot pain, right    Plan:  Patient was evaluated and treated and all questions  answered.  Plantar Fasciitis, right - XR reviewed as above.  - Re-Educated on icing and stretching. Instructions given.  - Injection delivered to the plantar fascia as below. - DME: Plantar fascial brace - Pharmacologic management:none  Flexible pes planus deformity -I explained to the patient the etiology of pes planus deformity in conjunction with plantar fasciitis.  I explained to the patient that custom-made orthotics and stretching are long-term management of plantar fasciitis.  Patient will be scheduled to see Raiford Noble to get the orthotics made.  Bilateral onychomycosis toenails  x10 -Her LFTs were reviewed.  They were normal.  It was determined that patient will benefit from starting Lamisil therapy. -She will be on Lamisil terbinafine for 3 months afterward she will come see me to decide whether she needs to be on course for another 3 months or discontinue the therapy.  Procedure: Injection Tendon/Ligament Location: Right plantar fascia at the glabrous junction; medial approach. Skin Prep: alcohol Injectate: 0.5 cc 0.5% marcaine plain, 0.5 cc of 1% Lidocaine, 0.5 cc kenalog 10. Disposition: Patient tolerated procedure well. Injection site dressed with a band-aid.  Return for See Raiford Noble for orthotics.

## 2019-01-03 NOTE — Progress Notes (Signed)
  Chief Complaint  Patient presents with  . Follow-up    Adjust Disorder    HPI   Deborah James presents for f/u of adjustment disorder.  She admits she has not started the Paxil.  She went online and saw multiple side effects.  She had questions regarding f/u.  Still having stressors with her significant other who is not able to financially reciprocate without a job right now.  The patient is experiencing frustration with this that will lead to arguments.  Klonopin has been effective in dealing with the anxiety/stress from this.   We discussed the Paxil and all questions were answered regarding the medication.    Problem List    Problem List: 2020-11: Adjustment disorder with mixed anxiety and depressed mood 2020-11: Asthma 2020-11: Fatigue 2020-02: GDM (gestational diabetes mellitus) 2019-12: Abnormal glucose tolerance test (GTT) during pregnancy,  antepartum 2018-09: Obesity (BMI 30-39.9) 2018-09: HELLP syndrome (HELLP), third trimester 2018-09: Fetal demise, greater than 22 weeks, antepartum   Allergies   has No Known Allergies.  Medications    Current Outpatient Medications:  .  albuterol (VENTOLIN HFA) 108 (90 Base) MCG/ACT inhaler, Inhale 2 puffs into the lungs every 6 (six) hours as needed for wheezing or shortness of breath., Disp: 18 g, Rfl: 3 .  clonazePAM (KLONOPIN) 0.5 MG tablet, Take 1 tablet (0.5 mg total) by mouth 2 (two) times daily as needed for anxiety., Disp: 30 tablet, Rfl: 1 .  PARoxetine (PAXIL) 20 MG tablet, Take 1 tablet (20 mg total) by mouth daily., Disp: 30 tablet, Rfl: 1 .  terbinafine (LAMISIL) 250 MG tablet, Take 1 tablet (250 mg total) by mouth daily., Disp: 90 tablet, Rfl: 0 .  valACYclovir (VALTREX) 1000 MG tablet, Take 1,000 mg by mouth at bedtime. , Disp: , Rfl:    Review of Systems    Review of Systems  Constitutional: Positive for malaise/fatigue.  Cardiovascular: Negative for chest pain.  Psychiatric/Behavioral: Positive for depression. The  patient is nervous/anxious.     Physical Exam:   Physical Examination: General appearance - alert, well appearing, and in no distress and oriented to person, place, and time Mental Status: normal mood, behavior, speech, dress, motor activity, and thought processes, affect appropriate to mood.  Lab Review   labs are reviewed, up to date and normal.   Assessment & Plan:   1. Adjustment disorder with mixed anxiety and depressed mood    Meds ordered this encounter  Medications  . clonazePAM (KLONOPIN) 0.5 MG tablet    Sig: Take 1 tablet (0.5 mg total) by mouth 2 (two) times daily as needed for anxiety.    Dispense:  30 tablet    Refill:  1     Glyn Ade, NP

## 2019-01-20 ENCOUNTER — Ambulatory Visit: Payer: BC Managed Care – PPO | Admitting: Orthotics

## 2019-01-24 ENCOUNTER — Ambulatory Visit (INDEPENDENT_AMBULATORY_CARE_PROVIDER_SITE_OTHER): Payer: BC Managed Care – PPO | Admitting: Orthotics

## 2019-01-24 ENCOUNTER — Other Ambulatory Visit: Payer: Self-pay

## 2019-01-24 DIAGNOSIS — G5751 Tarsal tunnel syndrome, right lower limb: Secondary | ICD-10-CM

## 2019-01-24 DIAGNOSIS — M722 Plantar fascial fibromatosis: Secondary | ICD-10-CM | POA: Diagnosis not present

## 2019-01-24 NOTE — Progress Notes (Signed)

## 2019-01-30 DIAGNOSIS — Z79891 Long term (current) use of opiate analgesic: Secondary | ICD-10-CM | POA: Diagnosis not present

## 2019-01-30 DIAGNOSIS — F4323 Adjustment disorder with mixed anxiety and depressed mood: Secondary | ICD-10-CM | POA: Diagnosis not present

## 2019-02-14 ENCOUNTER — Telehealth: Payer: Self-pay | Admitting: Adult Health Nurse Practitioner

## 2019-02-14 ENCOUNTER — Other Ambulatory Visit: Payer: BC Managed Care – PPO | Admitting: Orthotics

## 2019-02-25 DIAGNOSIS — F4323 Adjustment disorder with mixed anxiety and depressed mood: Secondary | ICD-10-CM | POA: Diagnosis not present

## 2019-02-28 ENCOUNTER — Other Ambulatory Visit: Payer: BC Managed Care – PPO | Admitting: Orthotics

## 2019-02-28 ENCOUNTER — Ambulatory Visit: Payer: BC Managed Care – PPO | Admitting: Podiatry

## 2019-03-07 ENCOUNTER — Ambulatory Visit: Payer: BC Managed Care – PPO | Admitting: Adult Health Nurse Practitioner

## 2019-03-14 ENCOUNTER — Other Ambulatory Visit: Payer: Self-pay

## 2019-03-14 ENCOUNTER — Encounter: Payer: Self-pay | Admitting: Podiatry

## 2019-03-14 ENCOUNTER — Ambulatory Visit (INDEPENDENT_AMBULATORY_CARE_PROVIDER_SITE_OTHER): Payer: BC Managed Care – PPO | Admitting: Podiatry

## 2019-03-14 ENCOUNTER — Other Ambulatory Visit: Payer: BC Managed Care – PPO | Admitting: Orthotics

## 2019-03-14 DIAGNOSIS — M2141 Flat foot [pes planus] (acquired), right foot: Secondary | ICD-10-CM | POA: Diagnosis not present

## 2019-03-14 DIAGNOSIS — M2142 Flat foot [pes planus] (acquired), left foot: Secondary | ICD-10-CM

## 2019-03-14 DIAGNOSIS — M722 Plantar fascial fibromatosis: Secondary | ICD-10-CM | POA: Diagnosis not present

## 2019-03-14 DIAGNOSIS — B351 Tinea unguium: Secondary | ICD-10-CM

## 2019-03-18 ENCOUNTER — Encounter: Payer: Self-pay | Admitting: Podiatry

## 2019-03-18 NOTE — Progress Notes (Signed)
Subjective:  Patient ID: Deborah James, female    DOB: 1985-12-24,  MRN: [id number]  Chief Complaint  Patient presents with  . Plantar Fasciitis    R foot follow up; "still very painful, no relief since last visit"    34 y.o. female presents with the above complaint.  Patient states her right heel pain came acutely back.  She states that she is trying to do her stretching exercising and wearing her bracing but is not making it go away.  She was doing much better with greater than 95% resolved when she last came to see me.  However plantar fasciitis pain started back up again.  She never saw Liliane Channel to get the orthotics made.  She states her pain is 6 out of 10 without the brace with the brace is much better controlled.  She denies any other acute complaints.  Review of Systems: Negative except as noted in the HPI. Denies N/V/F/Ch.  Past Medical History:  Diagnosis Date  . Adjustment disorder with mixed anxiety and depressed mood 12/13/2018  . Asthma   . Chicken pox    As a child  . Diabetes mellitus without complication (HCC)    GDM  . Fatigue 12/13/2018  . Fetal demise in singleton pregnancy greater than [redacted] weeks gestation, antepartum 09/26/2016  . Genital HSV   . GERD (gastroesophageal reflux disease)   . Hypertension    only with preg     Current Outpatient Medications:  .  albuterol (VENTOLIN HFA) 108 (90 Base) MCG/ACT inhaler, Inhale 2 puffs into the lungs every 6 (six) hours as needed for wheezing or shortness of breath., Disp: 18 g, Rfl: 3 .  FLUoxetine (PROZAC) 20 MG capsule, Take 20 mg by mouth daily., Disp: , Rfl:  .  terbinafine (LAMISIL) 250 MG tablet, Take 1 tablet (250 mg total) by mouth daily., Disp: 90 tablet, Rfl: 0 .  valACYclovir (VALTREX) 1000 MG tablet, Take 1,000 mg by mouth at bedtime. , Disp: , Rfl:  .  clonazePAM (KLONOPIN) 0.5 MG tablet, Take 1 tablet (0.5 mg total) by mouth 2 (two) times daily as needed for anxiety., Disp: 30 tablet, Rfl: 1 .  PARoxetine  (PAXIL) 20 MG tablet, Take 1 tablet (20 mg total) by mouth daily., Disp: 30 tablet, Rfl: 1  Social History   Tobacco Use  Smoking Status Former Smoker  . Quit date: 02/27/2016  . Years since quitting: 3.0  Smokeless Tobacco Never Used    No Known Allergies Objective:  There were no vitals filed for this visit. There is no height or weight on file to calculate BMI. Constitutional Well developed. Well nourished.  Vascular Dorsalis pedis pulses palpable bilaterally. Posterior tibial pulses palpable bilaterally. Capillary refill normal to all digits.  No cyanosis or clubbing noted. Pedal hair growth normal.  Neurologic Normal speech. Oriented to person, place, and time. Epicritic sensation to light touch grossly present bilaterally.  Dermatologic  onychomycosis to bilateral digits x10.  Thickened elongated mycotic mildly painful. No open wounds. No skin lesions.  Orthopedic: Normal joint ROM without pain or crepitus bilaterally. No visible deformities. Tender to palpation at the calcaneal tuber right. No pain with calcaneal squeeze right. Ankle ROM diminished range of motion right. Silfverskiold Test: positive right.   Radiographs: None  Assessment:   1. Nail fungus   2. Plantar fasciitis of right foot    Plan:  Patient was evaluated and treated and all questions answered.  Plantar Fasciitis, right -It appears that the right plantar fascia  is not improving with injections as well as custom-made orthotics.  I believe patient would need a more aggressive immobilization of the right foot.  Cam boot was discussed with the patient and will be dispensed.  This will help completely immobilize the plantar fascia and therefore decrease some of her pain. -I also briefly discussed with her that if her pain does not get resolved with a cam boot immobilization will need to consider surgical intervention.  Flexible pes planus deformity -Patient obtain custom-made orthotics.  The  orthotics were evaluated by me and it appears to be intact and well fitting.  Her gait is controlled.  Bilateral onychomycosis toenails x10 -It appears that her toenails are improving from the last course of Lamisil therapy.  I believe patient will benefit from another course of Lamisil therapy.  Prior to starting her again on a 16-month course she will need to obtain a new liver function test. -Prescription for liver function test was delivered.  Procedure: Injection Tendon/Ligament Location: Right plantar fascia at the glabrous junction; medial approach. Skin Prep: alcohol Injectate: 0.5 cc 0.5% marcaine plain, 0.5 cc of 1% Lidocaine, 0.5 cc kenalog 10. Disposition: Patient tolerated procedure well. Injection site dressed with a band-aid.  No follow-ups on file.

## 2019-04-06 IMAGING — US US MFM OB LIMITED
1 series · 10 of 10 positions shown · non-contrast
Comparison: none

[Series 1: us mfm ob limited · 10 acquisitions, 10 frames shown]
[im 1/10]
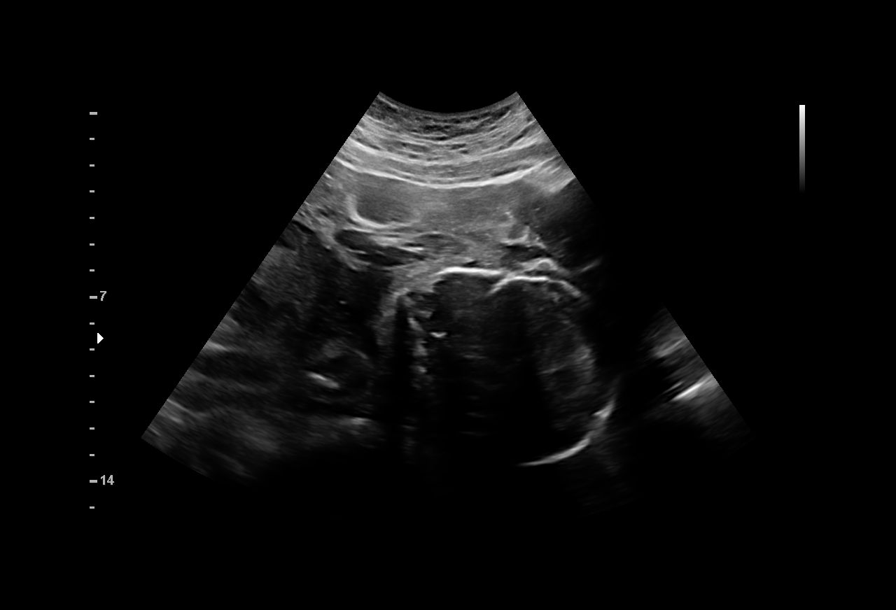
[im 2/10]
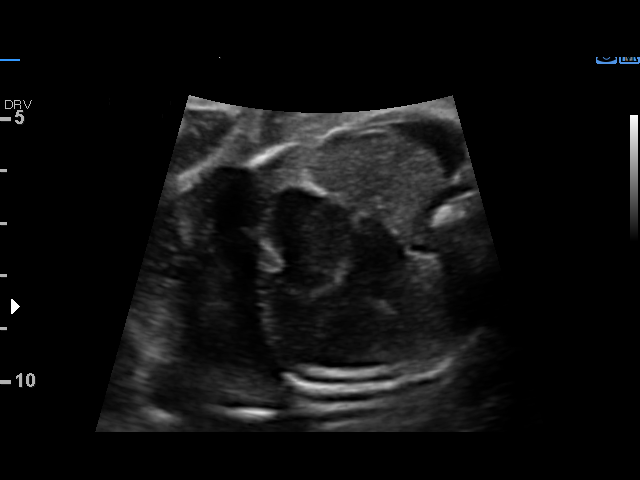
[im 3/10]
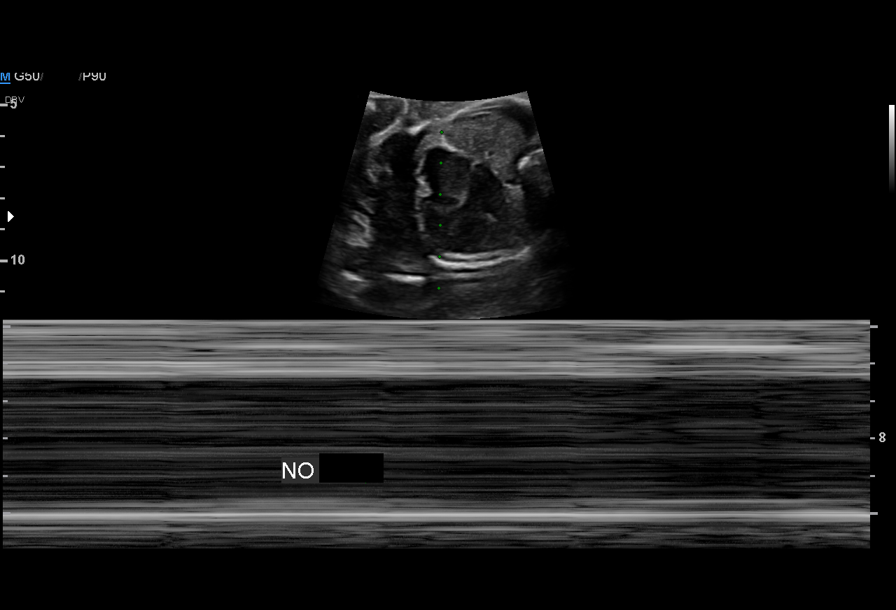
[im 4/10]
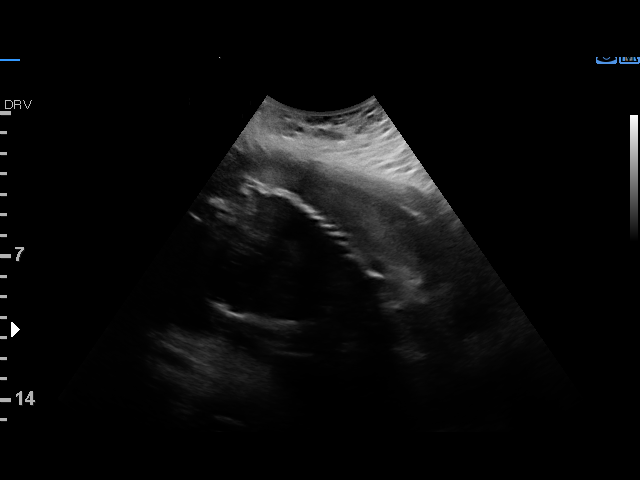
[im 5/10]
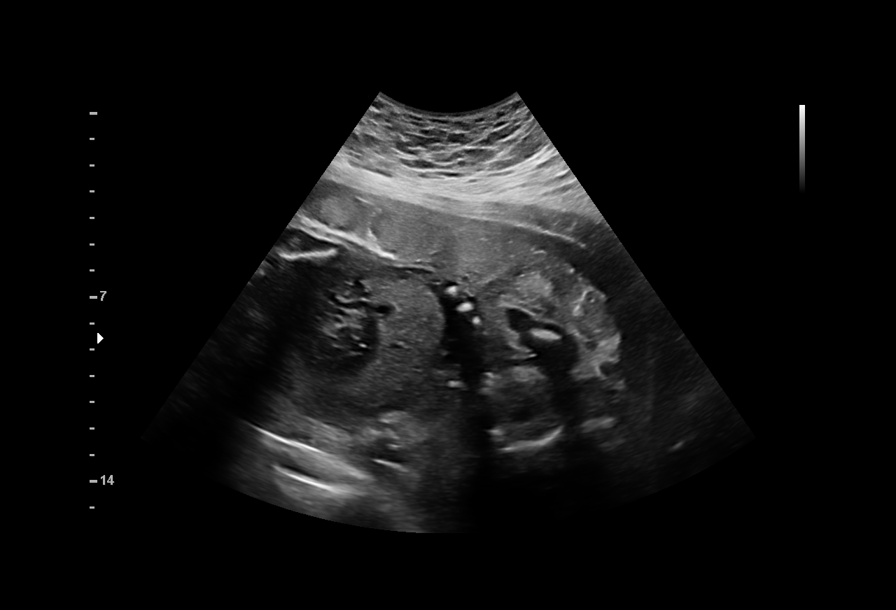
[im 6/10]
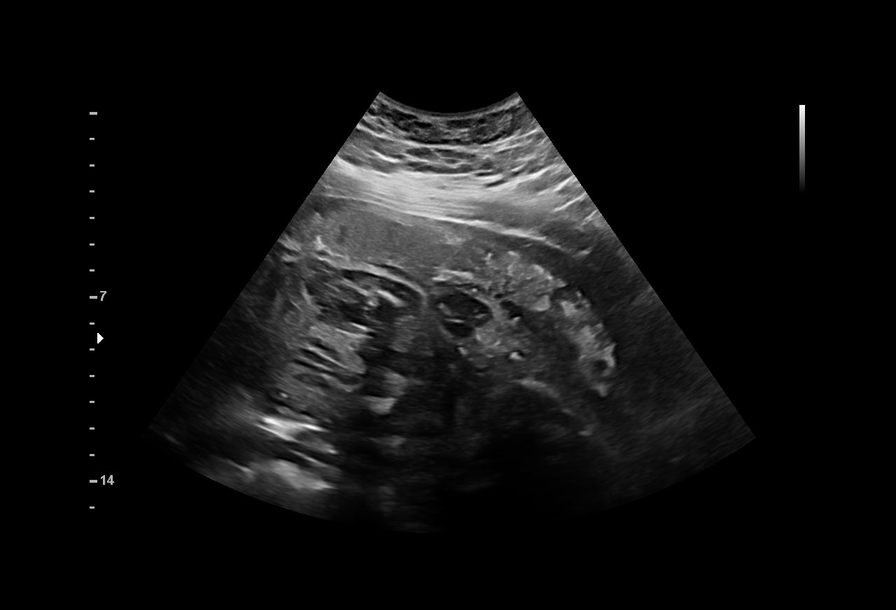
[im 7/10]
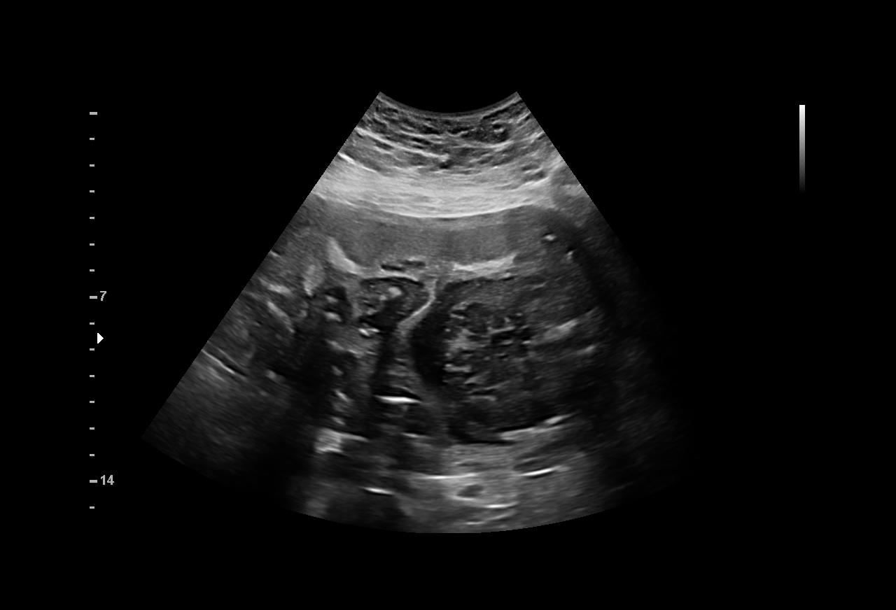
[im 8/10]
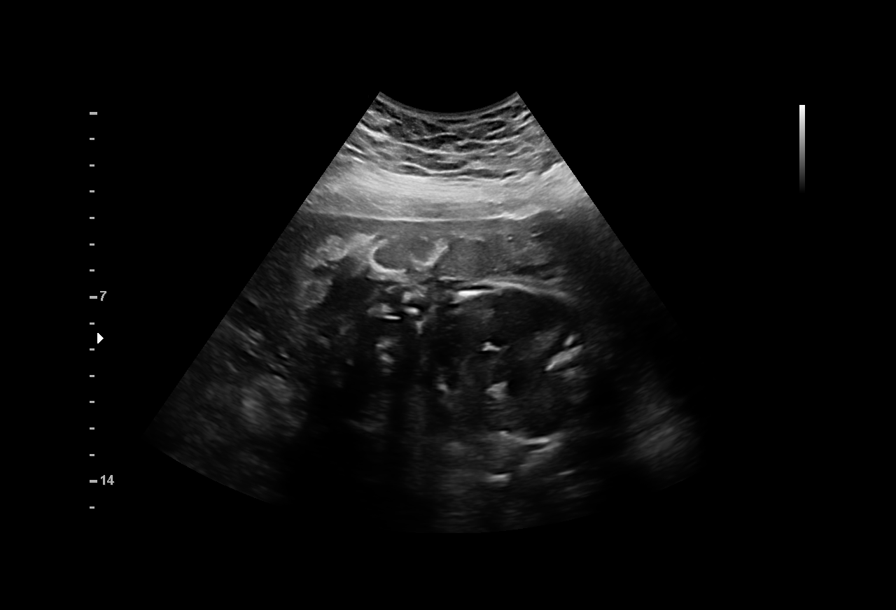
[im 9/10]
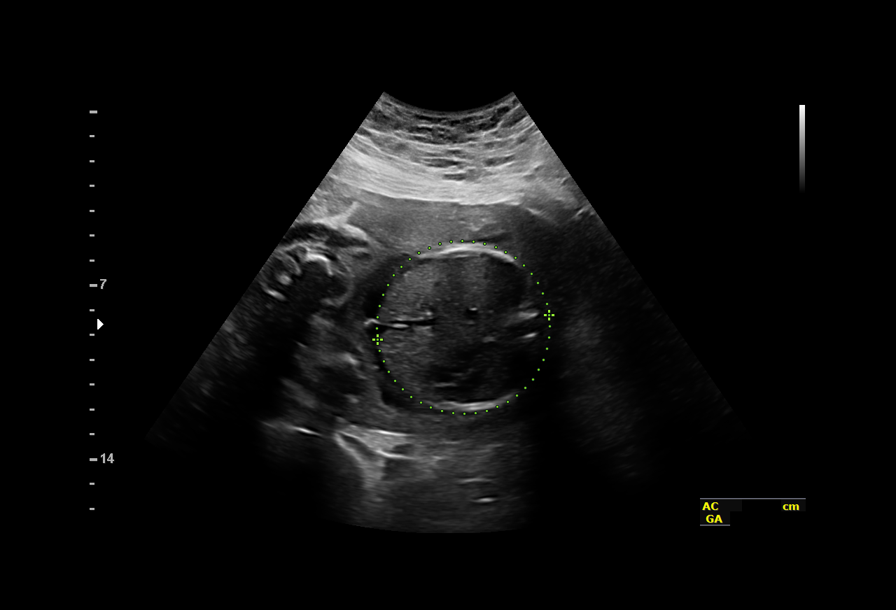
[im 10/10]
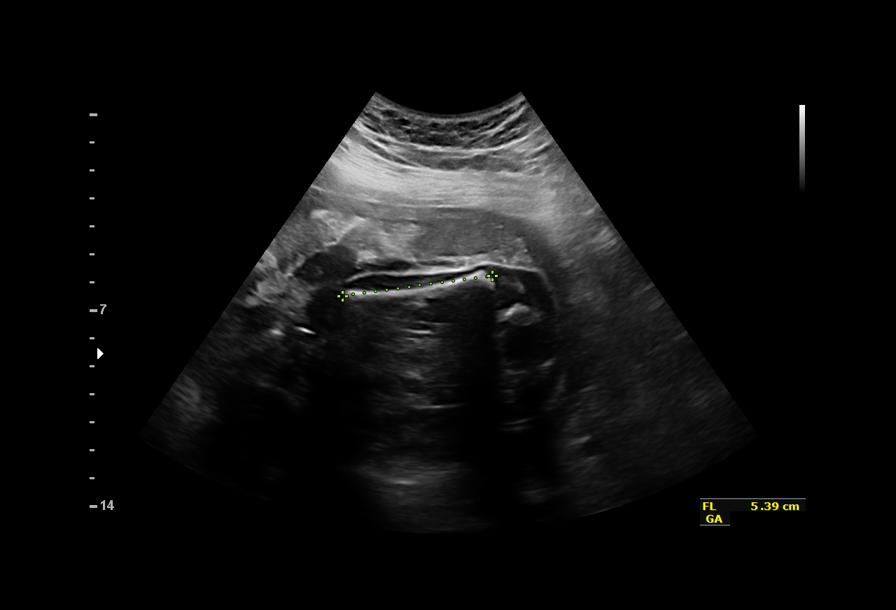

[10 of 10 positions shown; findings below may reference images not displayed]

1  FRAIDER [place]           [id number]      [id number]     [id number]
Indications

31 weeks gestation of pregnancy
Fetal demise greater than 22 weeks
Decreased fetal movement
OB History

Blood Type:            Height:  6'1"   Weight (lb):  262       BMI:
Gravidity:    1
Fetal Evaluation

Num Of Fetuses:     1
Cardiac Activity:   Absent
Presentation:       Cephalic
Placenta:           Anterior, above cervical os

Amniotic Fluid
AFI FV:      Oligohydramnios
Biometry

AC:      218.7  mm     G. Age:  26w 3d        < 3  %
FL:       53.9  mm     G. Age:  28w 4d        < 3  %    FL/AC:      24.6   %    20 - 24
Gestational Age

LMP:           31w 5d        Date:  02/17/16                 EDD:   11/23/16
U/S Today:     27w 4d                                        EDD:   12/22/16
Best:          31w 5d     Det. By:  LMP  (02/17/16)          EDD:   11/23/16
Cervix Uterus Adnexa
Cervix
Not visualized (advanced GA >93wks)
Impression

IUP at 31+5 weeks with decreased fetal movement
Cephalic presentation
Anhydramnios
No fetal cardiac activity
Recommendations

IUFD; the femer length and measured abdominal
circumfrence are <3rd percentile for the EGA
Continue clinical evaluation and management

## 2019-04-24 DIAGNOSIS — F4323 Adjustment disorder with mixed anxiety and depressed mood: Secondary | ICD-10-CM | POA: Diagnosis not present

## 2019-04-25 ENCOUNTER — Encounter: Payer: Self-pay | Admitting: Podiatry

## 2019-04-25 ENCOUNTER — Ambulatory Visit (INDEPENDENT_AMBULATORY_CARE_PROVIDER_SITE_OTHER): Payer: BC Managed Care – PPO | Admitting: Podiatry

## 2019-04-25 ENCOUNTER — Other Ambulatory Visit: Payer: Self-pay

## 2019-04-25 DIAGNOSIS — M722 Plantar fascial fibromatosis: Secondary | ICD-10-CM | POA: Diagnosis not present

## 2019-04-25 DIAGNOSIS — B351 Tinea unguium: Secondary | ICD-10-CM | POA: Diagnosis not present

## 2019-04-25 DIAGNOSIS — M2141 Flat foot [pes planus] (acquired), right foot: Secondary | ICD-10-CM

## 2019-04-25 DIAGNOSIS — M2142 Flat foot [pes planus] (acquired), left foot: Secondary | ICD-10-CM | POA: Diagnosis not present

## 2019-04-25 NOTE — Progress Notes (Signed)
Subjective:  Patient ID: Deborah James, female    DOB: Nov 01, 1985,  MRN: [id number]  No chief complaint on file.   34 y.o. female presents with the above complaint.  Patient is following up for right heel pain which has completely resolved.  She states the cam boot immobilization helped her a lot.  She was able to transition to regular sneakers with orthotics.  The orthotics have been helping her tremendously.  She is able to do her normal activities without pain.  She denies any other acute complaints by  Review of Systems: Negative except as noted in the HPI. Denies N/V/F/Ch.  Past Medical History:  Diagnosis Date  . Adjustment disorder with mixed anxiety and depressed mood 12/13/2018  . Asthma   . Chicken pox    As a child  . Diabetes mellitus without complication (HCC)    GDM  . Fatigue 12/13/2018  . Fetal demise in singleton pregnancy greater than [redacted] weeks gestation, antepartum 09/26/2016  . Genital HSV   . GERD (gastroesophageal reflux disease)   . Hypertension    only with preg     Current Outpatient Medications:  .  albuterol (VENTOLIN HFA) 108 (90 Base) MCG/ACT inhaler, Inhale 2 puffs into the lungs every 6 (six) hours as needed for wheezing or shortness of breath., Disp: 18 g, Rfl: 3 .  clonazePAM (KLONOPIN) 0.5 MG tablet, Take 1 tablet (0.5 mg total) by mouth 2 (two) times daily as needed for anxiety., Disp: 30 tablet, Rfl: 1 .  FLUoxetine (PROZAC) 20 MG capsule, Take 20 mg by mouth daily., Disp: , Rfl:  .  PARoxetine (PAXIL) 20 MG tablet, Take 1 tablet (20 mg total) by mouth daily., Disp: 30 tablet, Rfl: 1 .  terbinafine (LAMISIL) 250 MG tablet, Take 1 tablet (250 mg total) by mouth daily., Disp: 90 tablet, Rfl: 0 .  valACYclovir (VALTREX) 1000 MG tablet, Take 1,000 mg by mouth at bedtime. , Disp: , Rfl:   Social History   Tobacco Use  Smoking Status Former Smoker  . Quit date: 02/27/2016  . Years since quitting: 3.1  Smokeless Tobacco Never Used    No Known  Allergies Objective:  There were no vitals filed for this visit. There is no height or weight on file to calculate BMI. Constitutional Well developed. Well nourished.  Vascular Dorsalis pedis pulses palpable bilaterally. Posterior tibial pulses palpable bilaterally. Capillary refill normal to all digits.  No cyanosis or clubbing noted. Pedal hair growth normal.  Neurologic Normal speech. Oriented to person, place, and time. Epicritic sensation to light touch grossly present bilaterally.  Dermatologic  onychomycosis to bilateral digits x10.  Thickened elongated mycotic mildly painful. No open wounds. No skin lesions.  Orthopedic: Normal joint ROM without pain or crepitus bilaterally. No visible deformities. No tender to palpation at the calcaneal tuber right. No pain with calcaneal squeeze right. Ankle ROM diminished range of motion right. Silfverskiold Test: positive right.   Radiographs: None  Assessment:   1. Plantar fasciitis of right foot   2. Pes planus of both feet   3. Nail fungus    Plan:  Patient was evaluated and treated and all questions answered.  Plantar Fasciitis, right -Completely resolved.  Patient has been wearing her orthotics to with good shoes.  This seems to be helping her a lot.  She does not have any further pain. -We will hold off on any surgical intervention at this time given her pain is significantly improved.  Flexible pes planus deformity -Patient obtain  custom-made orthotics.  The orthotics were evaluated by me and it appears to be intact and well fitting.  Her gait is controlled.  Bilateral onychomycosis toenails x10 -It appears that her toenails are improving from the last course of Lamisil therapy.  I believe patient will benefit from another course of Lamisil therapy.  Prior to starting her again on a 50-month course she will need to obtain a new liver function test. -Prescription for liver function test was delivered.   Procedure:  Injection Tendon/Ligament Location: Right plantar fascia at the glabrous junction; medial approach. Skin Prep: alcohol Injectate: 0.5 cc 0.5% marcaine plain, 0.5 cc of 1% Lidocaine, 0.5 cc kenalog 10. Disposition: Patient tolerated procedure well. Injection site dressed with a band-aid.  No follow-ups on file.

## 2019-08-22 ENCOUNTER — Ambulatory Visit: Payer: BC Managed Care – PPO | Admitting: Podiatry

## 2020-04-24 IMAGING — US US MFM OB LIMITED
1 series · 15 of 23 positions shown · non-contrast
Comparison: none

[Series 1: us mfm ob limited · 23 acquisitions, 15 frames shown]
[im 1/23]
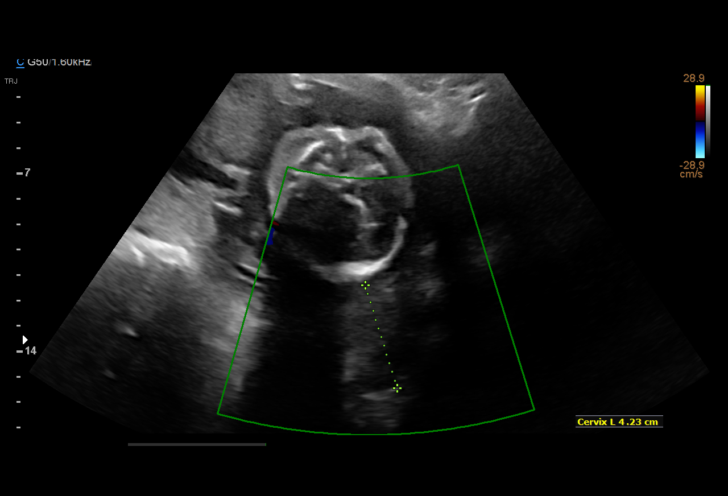
[im 3/23]
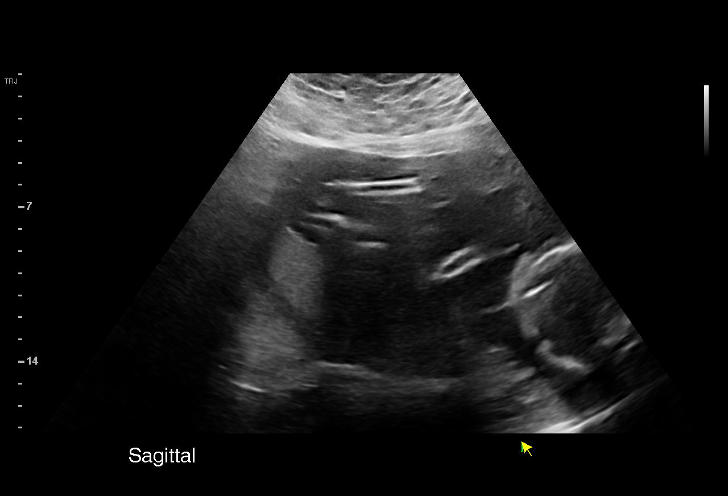
[im 4/23]
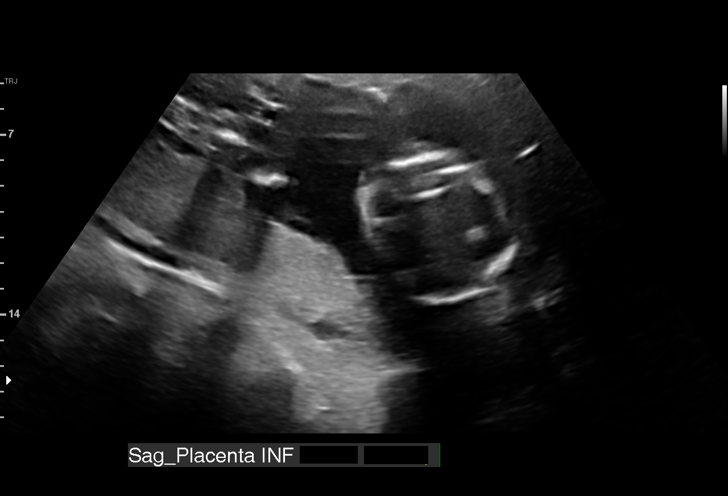
[im 6/23]
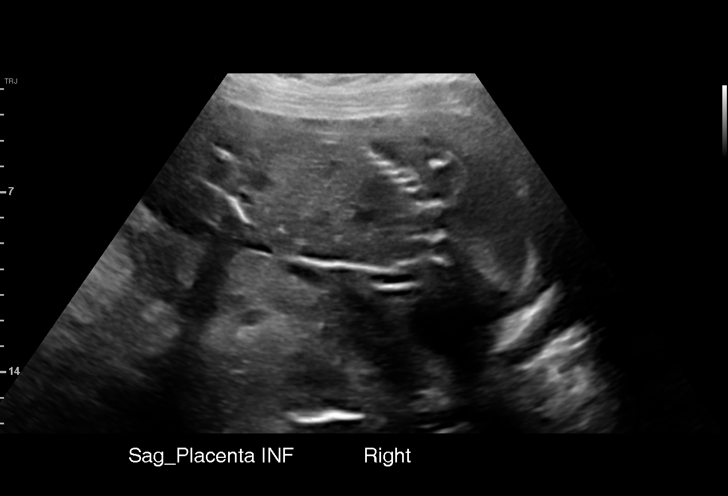
[im 7/23]
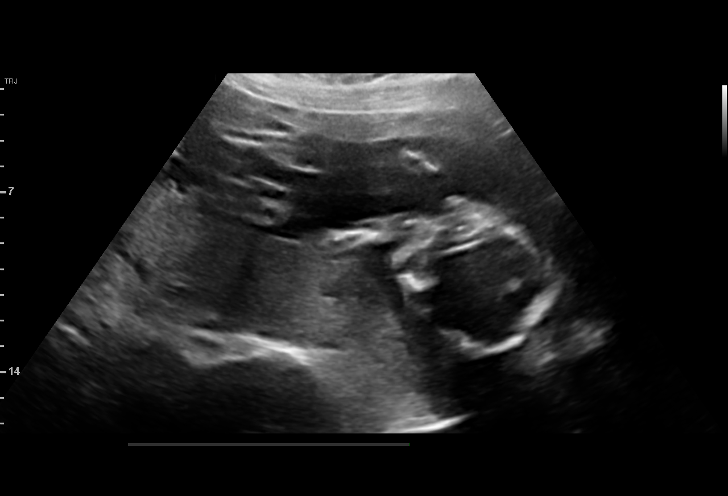
[im 9/23]
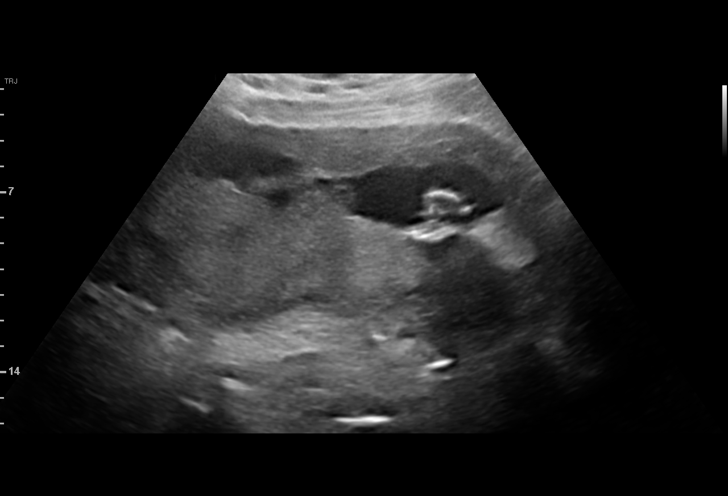
[im 10/23]
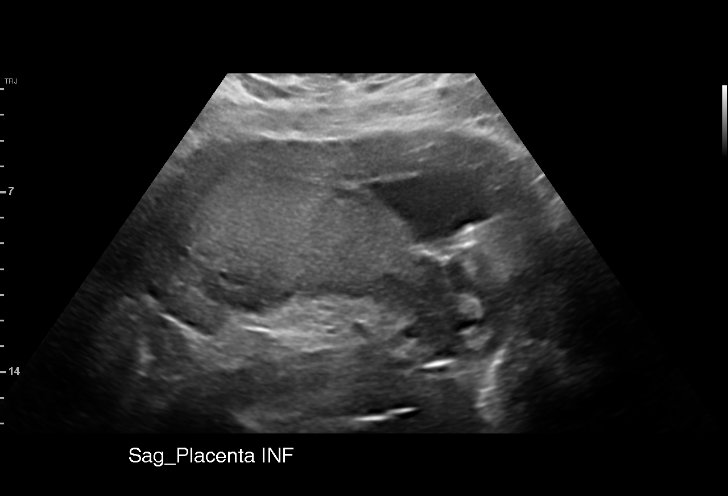
[im 12/23]
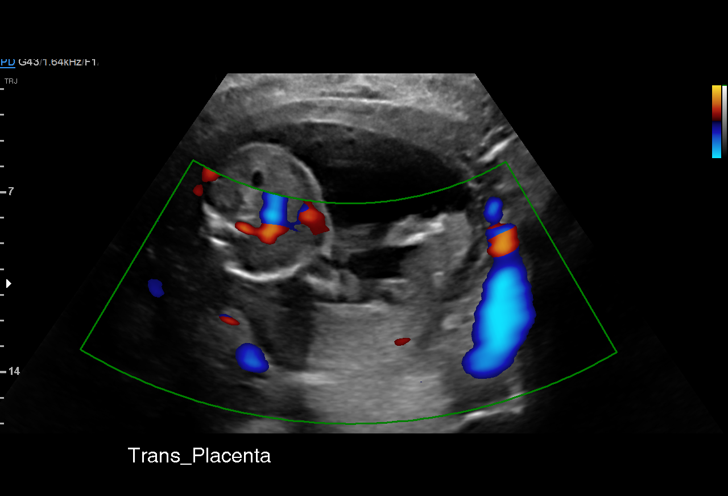
[im 14/23]
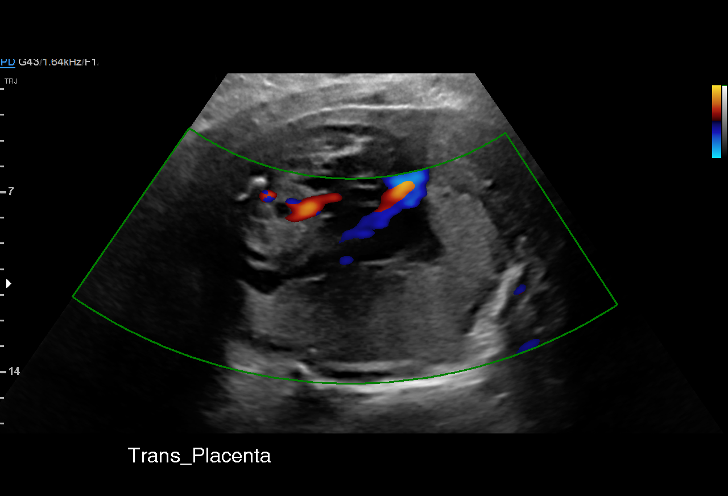
[im 15/23]
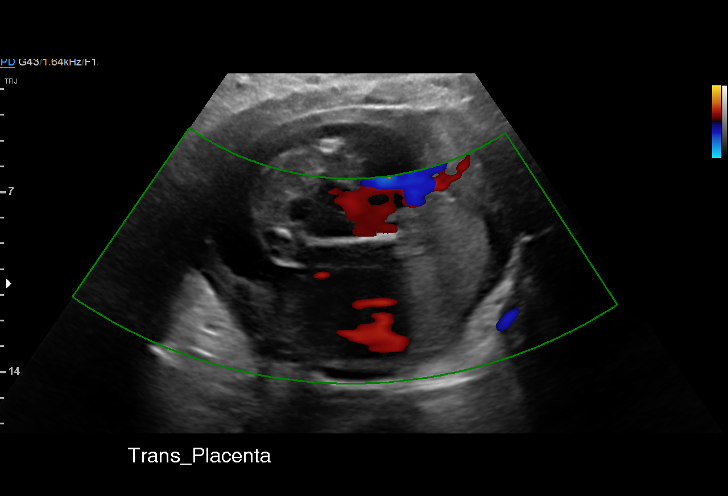
[im 17/23]
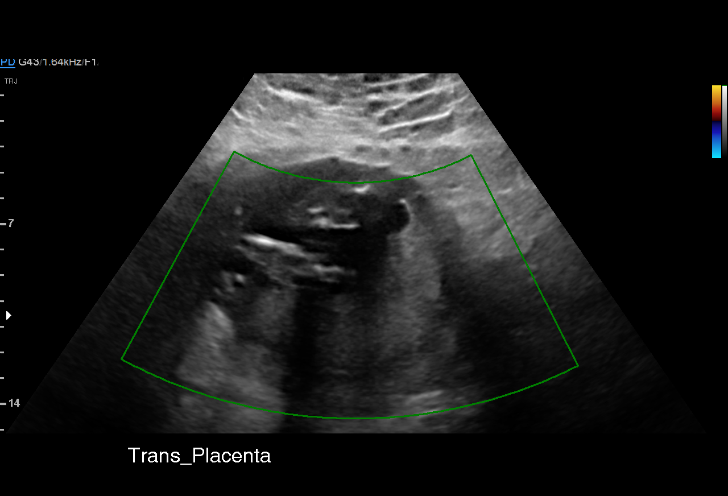
[im 18/23]
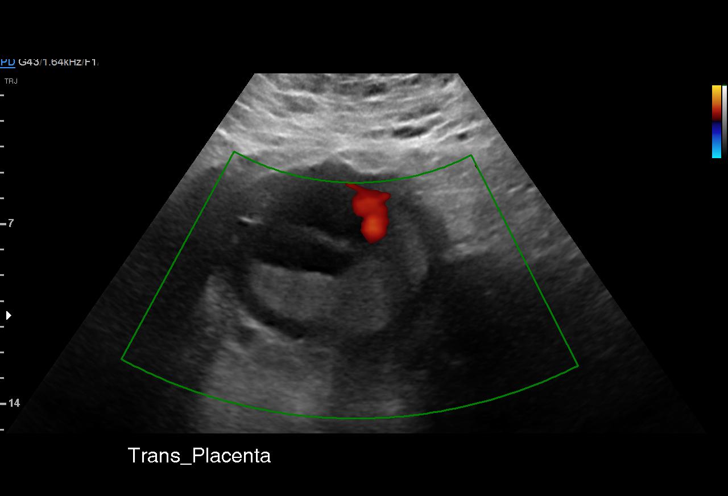
[im 20/23]
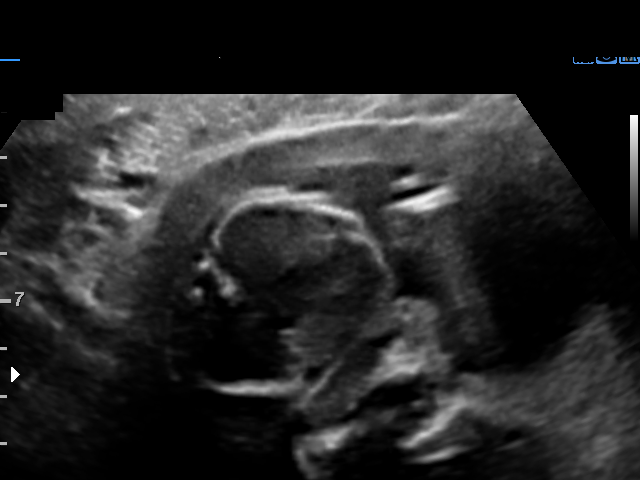
[im 21/23]
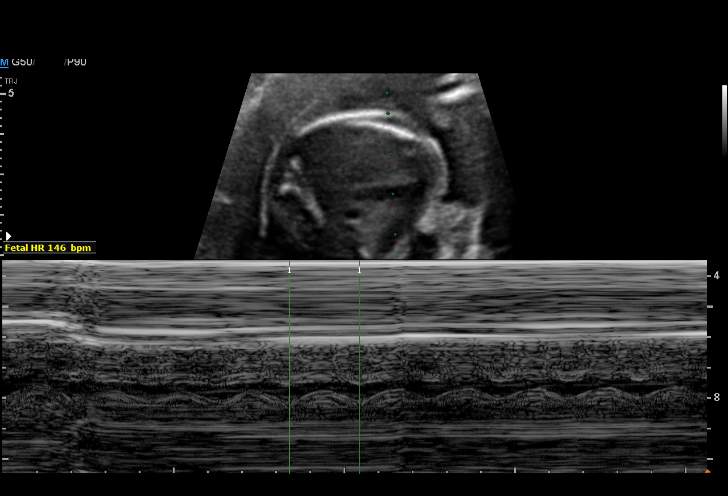
[im 23/23]
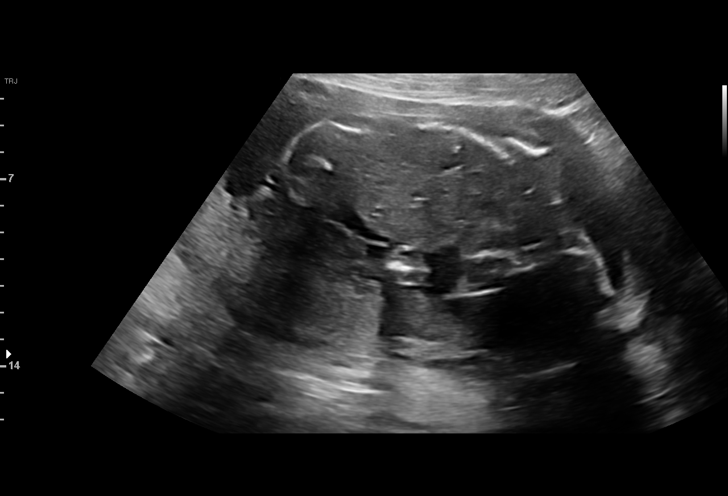

[15 of 23 positions shown; findings below may reference images not displayed]

Performed By:     Rubia Gaffer           Secondary Phy.:   [facility]
Attending:        Gogiko Haxnazarov        Address:          [REDACTED]
Obstetrics &
Gynecology
[address]
[place]
MAU/Triage

1  US MFM OB LIMITED                    [address].01     NOMASIBULELE MOATSHE

Indications

21 weeks gestation of pregnancy
Vaginal bleeding in pregnancy, second
trimester
Poor obstetric history: Previous IUFD
(stillbirth at 22wks; demise at 55wks)
Fetal Evaluation

Num Of Fetuses:         1
Fetal Heart Rate(bpm):  146
Cardiac Activity:       Observed
Presentation:           Cephalic
Placenta:               Posterior

Amniotic Fluid
AFI FV:      Subjectively normal
OB History

Gravidity:    3         Prem:   1
Gestational Age

LMP:           21w 0d        Date:  05/21/17                 EDD:   02/25/18
Best:          21w 0d     Det. By:  LMP  (05/21/17)          EDD:   02/25/18
Cervix Uterus Adnexa

Cervix
Length:           4.23  cm.
Normal appearance by transabdominal scan.
Comments

U/S images reviewed.  No evidence of placenta previa or
abruption.  Please remember that the absence of an
abruption on U/S does not rule it out.    No fetal abnormalities
are seen.
Questions answered.
10 minutes spent face to face with patient.
Recommendations: 1) Serial U/S every 4 weeks for fetal
growth 2) Weekly BPP beginning @ 36 weeks
Recommendations

1) Serial U/S every 4 weeks for fetal growth 2) Weekly BPP
beginning @ 36 weeks

## 2021-05-23 NOTE — Telephone Encounter (Signed)
NA
